# Patient Record
Sex: Male | Born: 1990 | Race: White | Hispanic: No | Marital: Married | Smoking: Never smoker
Health system: Southern US, Community
[De-identification: ages and names within clinical notes are randomized; demographics above are authoritative.]

## PROBLEM LIST (undated history)

## (undated) DIAGNOSIS — F900 Attention-deficit hyperactivity disorder, predominantly inattentive type: Secondary | ICD-10-CM

## (undated) DIAGNOSIS — R000 Tachycardia, unspecified: Principal | ICD-10-CM

## (undated) DIAGNOSIS — K219 Gastro-esophageal reflux disease without esophagitis: Secondary | ICD-10-CM

## (undated) DIAGNOSIS — M459 Ankylosing spondylitis of unspecified sites in spine: Secondary | ICD-10-CM

## (undated) DIAGNOSIS — N200 Calculus of kidney: Secondary | ICD-10-CM

## (undated) DIAGNOSIS — I10 Essential (primary) hypertension: Secondary | ICD-10-CM

## (undated) HISTORY — DX: Ankylosing spondylitis of unspecified sites in spine: M45.9

## (undated) HISTORY — DX: Essential (primary) hypertension: I10

## (undated) HISTORY — DX: Gastro-esophageal reflux disease without esophagitis: K21.9

## (undated) HISTORY — DX: Attention-deficit hyperactivity disorder, predominantly inattentive type: F90.0

## (undated) HISTORY — DX: Tachycardia, unspecified: R00.0

## (undated) HISTORY — DX: Calculus of kidney: N20.0

---

## 2015-11-02 DIAGNOSIS — M2550 Pain in unspecified joint: Secondary | ICD-10-CM | POA: Insufficient documentation

## 2015-11-02 DIAGNOSIS — R413 Other amnesia: Secondary | ICD-10-CM | POA: Insufficient documentation

## 2015-11-02 DIAGNOSIS — R253 Fasciculation: Secondary | ICD-10-CM | POA: Insufficient documentation

## 2015-11-02 DIAGNOSIS — R5383 Other fatigue: Secondary | ICD-10-CM | POA: Insufficient documentation

## 2016-07-15 DIAGNOSIS — Z87442 Personal history of urinary calculi: Secondary | ICD-10-CM | POA: Insufficient documentation

## 2017-09-16 NOTE — Progress Notes (Signed)
Cardiology Office Note   Date:  09/17/2017   ID:  Travis Dyer, DOB 01-23-1991, MRN [id number]  PCP:  [facility]  Cardiologist:   Chilton Si, MD   No chief complaint on file.    History of Present Illness: Travis Dyer is a 27 y.o. male with  Ankylosing spondylitis who is being seen today for the evaluation of chest pain and palpitations at the request of [name], PA-C.  Travis Dyer has a history of chest pain and shortness of breath dating back to 2016.  He saw Dr. Josiah Lobo in 2017.  He continues to have exertional chest pain.  He underwent an ETT that was negative for ischemia and he achieved 13 METS on a Bruce protocol.  He also had an echo that was reportedly normal at that time.   Since that time he continues to have exertional chest pain and shortness of breath that limits his ability to go up steps and exercise.  In the past he was very physically active.  Since that time he has been diagnosed with ankylosing spondylitis.  This has also limited his activity.  Last month he was diagnosed with hypertension.  Both parents have hypertension but developed it at a later age in life.  For the last several months Travis Dyer has also noted episodes of heart racing.  It occurs approximately once every 2 weeks.  He has more severe episodes approximately once per month for the last for 5 months.  The episodes last 5 or 10 minutes.  He gets lightheaded and dizzy but has no syncope.  He gets short of breath but has no chest pain.  His heart rate goes as fast as 180 or 200 bpm.  There is no clear precipitant.  He rarely drinks sweet tea and has no other caffeine.  He does not use any over-the-counter cold or cough medications.  He notes that his job is stressful but he does not feel particularly stressed or anxious when this occurs.  It is not tender occur with exertion.   Past Medical History:  Diagnosis Date  . Ankylosing spondylitis (HCC) 09/17/2017  . Essential hypertension 09/17/2017    . GERD (gastroesophageal reflux disease) 09/17/2017  . Kidney stones 09/17/2017    History reviewed. No pertinent surgical history.   Current Outpatient Medications  Medication Sig Dispense Refill  . DEXILANT 60 MG capsule Take 60 mg by mouth daily.     Marland Kitchen HUMIRA PEN 40 MG/0.4ML PNKT     . lisinopril (PRINIVIL,ZESTRIL) 5 MG tablet     . metoprolol tartrate (LOPRESSOR) 50 MG tablet 1 TABLET 1 HOUR PRIOR TO PROCEDURE 1 tablet 0   No current facility-administered medications for this visit.     Allergies:   Patient has no allergy information on record.    Social History:  The patient  reports that he has never smoked. He has never used smokeless tobacco.   Family History:  The patient's family history includes CAD in his maternal grandfather; Diabetes in his maternal grandmother; Hypertension in his father and mother; Pancreatic cancer in his maternal grandmother; Stroke in his paternal grandfather; Thyroid disease in his sister.    ROS:  Please see the history of present illness.   Otherwise, review of systems are positive for none.   All other systems are reviewed and negative.    PHYSICAL EXAM: VS:  BP 122/82 (BP Location: Right Arm)   Pulse 77   Ht 5\' 8"  (1.727 m)  Wt 187 lb 3.2 oz (84.9 kg)   BMI 28.46 kg/m  , BMI Body mass index is 28.46 kg/m. GENERAL:  Well appearing HEENT:  Pupils equal round and reactive, fundi not visualized, oral mucosa unremarkable NECK:  No jugular venous distention, waveform within normal limits, carotid upstroke brisk and symmetric, no bruits, no thyromegaly LYMPHATICS:  No cervical adenopathy LUNGS:  Clear to auscultation bilaterally HEART:  RRR.  PMI not displaced or sustained,S1 and S2 within normal limits, no S3, no S4, no clicks, no rubs, no murmurs ABD:  Flat, positive bowel sounds normal in frequency in pitch, no bruits, no rebound, no guarding, no midline pulsatile mass, no hepatomegaly, no splenomegaly EXT:  2 plus pulses throughout, no  edema, no cyanosis no clubbing SKIN:  No rashes no nodules NEURO:  Cranial nerves II through XII grossly intact, motor grossly intact throughout PSYCH:  Cognitively intact, oriented to person place and time    EKG:  EKG is ordered today. The ekg ordered 09/17/17 demonstrates sinus rhythm.  Rate 77 bpm.  Short PR interval.   ETT 03/31/16:  Negative, adequate stress test.  Exercised for 10.5 minutes on a Bruce protocol.  13 METS.  Echo (10/17): EF>55%, normal valvular fx, normal RVSF   Recent Labs: No results found for requested labs within last 8760 hours.    Lipid Panel No results found for: CHOL, TRIG, HDL, CHOLHDL, VLDL, LDLCALC, LDLDIRECT    Wt Readings from Last 3 Encounters:  09/17/17 187 lb 3.2 oz (84.9 kg)      ASSESSMENT AND PLAN:  # Chest pain: Mr. Travis BergeronKey continues to have exertional chest pain that is relieved with rest.  He had a normal ETT and echo in 2017.  We will get a coronary CT-A to evaluate for ischemia or coronary anomalies.  Check lipids and CMP.    # Palpitations: Recurrent symptoms lasting several minutes at a time.  He has a short PR interval on EKG.  There is no evidence of delta waves.  We will get a 30-day event monitor to further evaluate for Wolff-Parkinson-White syndrome or other arrhythmias.  Check thyroid function, CMP and CBC.   # Hypertension: BP controlled on lisinopril.   Current medicines are reviewed at length with the patient today.  The patient does not have concerns regarding medicines.  The following changes have been made:  no change  Labs/ tests ordered today include:   Orders Placed This Encounter  Procedures  . CT CORONARY MORPH W/CTA COR W/SCORE W/CA W/CM &/OR WO/CM  . CT CORONARY FRACTIONAL FLOW RESERVE DATA PREP  . CT CORONARY FRACTIONAL FLOW RESERVE FLUID ANALYSIS  . CBC with Differential/Platelet  . T4, free  . TSH  . Lipid panel  . Comprehensive metabolic panel  . EKG 12-Lead     Disposition:   FU with Jasnoor Trussell C.  Duke Salviaandolph, MD, [facility] in 2 months.    This note was written with the assistance of speech recognition software.  Please excuse any transcriptional errors.  Signed, Sumiye Hirth C. Duke Salviaandolph, MD, [facility]  09/17/2017 10:54 AM    [facility]

## 2017-09-17 ENCOUNTER — Ambulatory Visit: Payer: 59 | Admitting: Cardiovascular Disease

## 2017-09-17 ENCOUNTER — Encounter: Payer: Self-pay | Admitting: Cardiovascular Disease

## 2017-09-17 VITALS — BP 122/82 | HR 77 | Ht 68.0 in | Wt 187.2 lb

## 2017-09-17 DIAGNOSIS — N200 Calculus of kidney: Secondary | ICD-10-CM | POA: Diagnosis not present

## 2017-09-17 DIAGNOSIS — K219 Gastro-esophageal reflux disease without esophagitis: Secondary | ICD-10-CM | POA: Diagnosis not present

## 2017-09-17 DIAGNOSIS — M459 Ankylosing spondylitis of unspecified sites in spine: Secondary | ICD-10-CM

## 2017-09-17 DIAGNOSIS — R079 Chest pain, unspecified: Secondary | ICD-10-CM | POA: Diagnosis not present

## 2017-09-17 DIAGNOSIS — I10 Essential (primary) hypertension: Secondary | ICD-10-CM | POA: Insufficient documentation

## 2017-09-17 DIAGNOSIS — R002 Palpitations: Secondary | ICD-10-CM | POA: Diagnosis not present

## 2017-09-17 HISTORY — DX: Ankylosing spondylitis of unspecified sites in spine: M45.9

## 2017-09-17 HISTORY — DX: Gastro-esophageal reflux disease without esophagitis: K21.9

## 2017-09-17 HISTORY — DX: Calculus of kidney: N20.0

## 2017-09-17 HISTORY — DX: Essential (primary) hypertension: I10

## 2017-09-17 MED ORDER — METOPROLOL TARTRATE 50 MG PO TABS: ORAL_TABLET | ORAL | 0 refills | Status: DC

## 2017-09-17 NOTE — Patient Instructions (Addendum)
Medication Instructions:  Your physician recommends that you continue on your current medications as directed. Please refer to the Current Medication list given to you today.  Labwork: FASTING LP/CMET/CBC/TSH/FT4 SOON   Testing/Procedures: Your physician has recommended that you wear an event monitor. Event monitors are medical devices that record the heart's electrical activity. Doctors most often us these monitors to diagnose arrhythmias. Arrhythmias are problems with the speed or rhythm of the heartbeat. The monitor is a small, portable device. You can wear one while you do your normal daily activities. This is usually used to diagnose what is causing palpitations/syncope (passing out). [facility] AT [address]  CORONARY CTA, THE OFFICE WILL CALL WITH DATE AND TIME   Follow-Up: Your physician recommends that you schedule a follow-up appointment in: 2 MONTH OV   Any Other Special Instructions Will Be Listed Below (If Applicable).   [facility] at xx:xx AM (30-45 minutes prior to test start time)  [facility] [address] [place], [place] [address] [phone]  Proceed to the [facility] Radiology Department (First Floor).  Please follow these instructions carefully (unless otherwise directed):  Hold all erectile dysfunction medications at least 48 hours prior to test.  On the Night Before the Test: . Drink plenty of water. . Do not consume any caffeinated/decaffeinated beverages or chocolate 12 hours prior to your test. . Do not take any antihistamines 12 hours prior to your test. . If you take Metformin do not take 24 hours prior to test. . If the patient has contrast allergy: ? Patient will need a prescription for Prednisone and very clear instructions (as follows): 1. Prednisone 50 mg - take 13 hours prior to test 2. Take another Prednisone 50 mg 7 hours prior to test 3. Take  another Prednisone 50 mg 1 hour prior to test 4. Take Benadryl 50 mg 1 hour prior to test . Patient must complete all four doses of above prophylactic medications. . Patient will need a ride after test due to Benadryl.  On the Day of the Test: . Drink plenty of water. Do not drink any water within one hour of the test. . Do not eat any food 4 hours prior to the test. . You may take your regular medications prior to the test. . IF NOT ON A BETA BLOCKER - Take 50 mg of lopressor (metoprolol) one hour before the test. . HOLD Furosemide morning of the test.  After the Test: . Drink plenty of water. . After receiving IV contrast, you may experience a mild flushed feeling. This is normal. . On occasion, you may experience a mild rash up to 24 hours after the test. This is not dangerous. If this occurs, you can take Benadryl 25 mg and increase your fluid intake. . If you experience trouble breathing, this can be serious. If it is severe call 911 IMMEDIATELY. If it is mild, please call our office. . If you take any of these medications: Glipizide/Metformin, Avandament, Glucavance, please do not take 48 hours after completing test.    Cardiac CT Angiogram A cardiac CT angiogram is a procedure to look at the heart and the area around the heart. It may be done to help find the cause of chest pains or other symptoms of heart disease. During this procedure, a large X-ray machine, called a CT scanner, takes detailed pictures of the heart and the surrounding area after  a dye (contrast material) has been injected into blood vessels in the area. The procedure is also sometimes called a coronary CT angiogram, coronary artery scanning, or CTA. A cardiac CT angiogram allows the health care provider to see how well blood is flowing to and from the heart. The health care provider will be able to see if there are any problems, such as:  Blockage or narrowing of the coronary arteries in the heart.  Fluid around  the heart.  Signs of weakness or disease in the muscles, valves, and tissues of the heart.  Tell a health care provider about:  Any allergies you have. This is especially important if you have had a previous allergic reaction to contrast dye.  All medicines you are taking, including vitamins, herbs, eye drops, creams, and over-the-counter medicines.  Any blood disorders you have.  Any surgeries you have had.  Any medical conditions you have.  Whether you are pregnant or may be pregnant.  Any anxiety disorders, chronic pain, or other conditions you have that may increase your stress or prevent you from lying still. What are the risks? Generally, this is a safe procedure. However, problems may occur, including:  Bleeding.  Infection.  Allergic reactions to medicines or dyes.  Damage to other structures or organs.  Kidney damage from the dye or contrast that is used.  Increased risk of cancer from radiation exposure. This risk is low. Talk with your health care provider about: ? The risks and benefits of testing. ? How you can receive the lowest dose of radiation.  What happens before the procedure?  Wear comfortable clothing and remove any jewelry, glasses, dentures, and hearing aids.  Follow instructions from your health care provider about eating and drinking. This may include: ? For 12 hours before the test - avoid caffeine. This includes tea, coffee, soda, energy drinks, and diet pills. Drink plenty of water or other fluids that do not have caffeine in them. Being well-hydrated can prevent complications. ? For 4-6 hours before the test - stop eating and drinking. The contrast dye can cause nausea, but this is less likely if your stomach is empty.  Ask your health care provider about changing or stopping your regular medicines. This is especially important if you are taking diabetes medicines, blood thinners, or medicines to treat erectile dysfunction. What happens  during the procedure?  Hair on your chest may need to be removed so that small sticky patches called electrodes can be placed on your chest. These will transmit information that helps to monitor your heart during the test.  An IV tube will be inserted into one of your veins.  You might be given a medicine to control your heart rate during the test. This will help to ensure that good images are obtained.  You will be asked to lie on an exam table. This table will slide in and out of the CT machine during the procedure.  Contrast dye will be injected into the IV tube. You might feel warm, or you may get a metallic taste in your mouth.  You will be given a medicine (nitroglycerin) to relax (dilate) the arteries in your heart.  The table that you are lying on will move into the CT machine tunnel for the scan.  The person running the machine will give you instructions while the scans are being done. You may be asked to: ? Keep your arms above your head. ? Hold your breath. ? Stay very still, even if  the table is moving.  When the scanning is complete, you will be moved out of the machine.  The IV tube will be removed. The procedure may vary among health care providers and hospitals. What happens after the procedure?  You might feel warm, or you may get a metallic taste in your mouth from the contrast dye.  You may have a headache from the nitroglycerin.  After the procedure, drink water or other fluids to wash (flush) the contrast material out of your body.  Contact a health care provider if you have any symptoms of allergy to the contrast. These symptoms include: ? Shortness of breath. ? Rash or hives. ? A racing heartbeat.  Most people can return to their normal activities right after the procedure. Ask your health care provider what activities are safe for you.  It is up to you to get the results of your procedure. Ask your health care provider, or the department that is doing  the procedure, when your results will be ready. Summary  A cardiac CT angiogram is a procedure to look at the heart and the area around the heart. It may be done to help find the cause of chest pains or other symptoms of heart disease.  During this procedure, a large X-ray machine, called a CT scanner, takes detailed pictures of the heart and the surrounding area after a dye (contrast material) has been injected into blood vessels in the area.  Ask your health care provider about changing or stopping your regular medicines before the procedure. This is especially important if you are taking diabetes medicines, blood thinners, or medicines to treat erectile dysfunction.  After the procedure, drink water or other fluids to wash (flush) the contrast material out of your body. This information is not intended to replace advice given to you by your health care provider. Make sure you discuss any questions you have with your health care provider. Document Released: 05/18/2008 Document Revised: 04/24/2016 Document Reviewed: 04/24/2016 Elsevier Interactive Patient Education  2017 [employer].

## 2017-09-27 ENCOUNTER — Ambulatory Visit (INDEPENDENT_AMBULATORY_CARE_PROVIDER_SITE_OTHER): Payer: 59

## 2017-09-27 ENCOUNTER — Other Ambulatory Visit: Payer: Self-pay | Admitting: Internal Medicine

## 2017-09-27 ENCOUNTER — Other Ambulatory Visit: Payer: Self-pay | Admitting: Cardiovascular Disease

## 2017-09-27 DIAGNOSIS — R42 Dizziness and giddiness: Secondary | ICD-10-CM

## 2017-09-27 DIAGNOSIS — R002 Palpitations: Secondary | ICD-10-CM

## 2017-09-27 DIAGNOSIS — R9431 Abnormal electrocardiogram [ECG] [EKG]: Secondary | ICD-10-CM

## 2017-09-27 DIAGNOSIS — R000 Tachycardia, unspecified: Secondary | ICD-10-CM

## 2017-10-05 ENCOUNTER — Encounter: Payer: Self-pay | Admitting: Cardiovascular Disease

## 2017-11-09 ENCOUNTER — Ambulatory Visit (HOSPITAL_COMMUNITY): Payer: 59

## 2017-11-09 ENCOUNTER — Ambulatory Visit (HOSPITAL_COMMUNITY)
Admission: RE | Admit: 2017-11-09 | Discharge: 2017-11-09 | Disposition: A | Discharge status: Home / Self Care | Payer: 59 | Source: Ambulatory Visit | Attending: Cardiovascular Disease | Admitting: Cardiovascular Disease

## 2017-11-09 ENCOUNTER — Encounter (HOSPITAL_COMMUNITY): Payer: Self-pay

## 2017-11-09 DIAGNOSIS — R002 Palpitations: Secondary | ICD-10-CM | POA: Diagnosis present

## 2017-11-09 DIAGNOSIS — I10 Essential (primary) hypertension: Secondary | ICD-10-CM | POA: Diagnosis present

## 2017-11-09 DIAGNOSIS — R079 Chest pain, unspecified: Secondary | ICD-10-CM | POA: Diagnosis not present

## 2017-11-09 MED ORDER — NITROGLYCERIN 0.4 MG SL SUBL
SUBLINGUAL_TABLET | SUBLINGUAL | Status: AC
  Administered 2017-11-09: 0.8 mg via SUBLINGUAL
  Filled 2017-11-09: qty 2

## 2017-11-09 MED ORDER — NITROGLYCERIN 0.4 MG SL SUBL
0.8000 mg | SUBLINGUAL_TABLET | Freq: Once | SUBLINGUAL | Status: AC
  Administered 2017-11-09: 0.8 mg via SUBLINGUAL

## 2017-11-09 MED ORDER — METOPROLOL TARTRATE 5 MG/5ML IV SOLN
INTRAVENOUS | Status: AC
  Administered 2017-11-09: 5 mg via INTRAVENOUS
  Filled 2017-11-09: qty 15

## 2017-11-09 MED ORDER — METOPROLOL TARTRATE 5 MG/5ML IV SOLN
5.0000 mg | INTRAVENOUS | Status: DC | PRN
  Administered 2017-11-09: 5 mg via INTRAVENOUS

## 2017-11-09 MED ORDER — IOPAMIDOL (ISOVUE-370) INJECTION 76%
INTRAVENOUS | Status: AC
  Filled 2017-11-09: qty 100

## 2017-11-09 MED ORDER — IOPAMIDOL (ISOVUE-370) INJECTION 76%
100.0000 mL | Freq: Once | INTRAVENOUS | Status: AC | PRN
  Administered 2017-11-09: 80 mL via INTRAVENOUS

## 2017-12-07 ENCOUNTER — Encounter: Payer: Self-pay | Admitting: Cardiovascular Disease

## 2017-12-07 ENCOUNTER — Ambulatory Visit: Payer: 59 | Admitting: Cardiovascular Disease

## 2017-12-07 VITALS — BP 114/76 | HR 61 | Ht 68.0 in | Wt 188.0 lb

## 2017-12-07 DIAGNOSIS — R000 Tachycardia, unspecified: Secondary | ICD-10-CM | POA: Diagnosis not present

## 2017-12-07 DIAGNOSIS — I10 Essential (primary) hypertension: Secondary | ICD-10-CM

## 2017-12-07 DIAGNOSIS — I4711 Inappropriate sinus tachycardia, so stated: Secondary | ICD-10-CM

## 2017-12-07 HISTORY — DX: Inappropriate sinus tachycardia, so stated: I47.11

## 2017-12-07 HISTORY — DX: Tachycardia, unspecified: R00.0

## 2017-12-07 MED ORDER — METOPROLOL TARTRATE 25 MG PO TABS: ORAL_TABLET | ORAL | 3 refills | Status: DC

## 2017-12-07 NOTE — Patient Instructions (Addendum)
Medication Instructions:  START METOPROLOL TART 25 MG 1/2 TABLET TWICE A  DAY AS NEEDED OK TO INCREASE TO FULL TABLET IF NEEDED   Labwork: NONE  Testing/Procedures: NONE  Follow-Up: Your physician recommends that you schedule a follow-up appointment in: 6 MONTH OV   If you need a refill on your cardiac medications before your next appointment, please call your pharmacy.

## 2017-12-07 NOTE — Progress Notes (Signed)
Cardiology Office Note   Date:  12/07/2017   ID:  Travis Dyer, DOB May 17, 1991, MRN [id number]  PCP:  [facility]  Cardiologist:   Chilton Si, MD   Chief Complaint  Patient presents with  . Shortness of Breath    states with activity      History of Present Illness: Travis Dyer is a 27 y.o. male with ankylosing spondylitis here for follow up.  He was initially seen 09/2017 for chest pain and palpitations.  Travis Dyer has a history of chest pain and shortness of breath dating back to 2016.  He underwent an ETT that was negative for ischemia and he achieved 13 METS on a Bruce protocol.  He also had an echo that was reportedly normal at that time.   Since that time he continued to have exertional chest pain and shortness of breath that limits his ability to go up steps and exercise.  In the past he was very physically active.  Since that time he has been diagnosed with ankylosing spondylitis.  At his last appointment he was referred for coronary CT-a 10/2017 that revealed a coronary calcium score of 0 and no coronary artery stenosis.  No coronary anomalies were noted.  He also reported episodes of heart racing.  He wore a 30-day event monitor that showed no arrhythmias but episodes of sinus tachycardia up to the 160s at rest.  Since his last appointment Travis Dyer continues to have episodes of tachycardia.  They seem to occur sporadically.  He goes through phases where it happens frequently for 2 or 3 weeks and then not at all.  When he is having the episodes it occurs once or twice per week.  The episodes last between 5 and 10 minutes.  Afterwards he felt horrible.  These are the times when he typically experiences chest pain.  He notes that Humira is helping with his ankylosing spondylitis.  He walks 5 to 10 miles at work but does not get much formal exercise outside of work.  He has no chest pain or shortness of breath with exertion.  He denies any lower extremity edema, orthopnea or  PND.   Past Medical History:  Diagnosis Date  . Ankylosing spondylitis (HCC) 09/17/2017  . Essential hypertension 09/17/2017  . GERD (gastroesophageal reflux disease) 09/17/2017  . Inappropriate sinus tachycardia 12/07/2017  . Kidney stones 09/17/2017    History reviewed. No pertinent surgical history.   Current Outpatient Medications  Medication Sig Dispense Refill  . DEXILANT 60 MG capsule Take 60 mg by mouth daily.     Marland Kitchen HUMIRA PEN 40 MG/0.4ML PNKT     . lisinopril (PRINIVIL,ZESTRIL) 5 MG tablet     . metoprolol tartrate (LOPRESSOR) 25 MG tablet TAKE 1/2 TO 1 TABLET TWICE A DAY AS NEEDED 60 tablet 3   No current facility-administered medications for this visit.     Allergies:   Patient has no known allergies.    Social History:  The patient  reports that he has never smoked. He has never used smokeless tobacco.   Family History:  The patient's family history includes CAD in his maternal grandfather; Diabetes in his maternal grandmother; Hypertension in his father and mother; Pancreatic cancer in his maternal grandmother; Stroke in his paternal grandfather; Thyroid disease in his sister.    ROS:  Please see the history of present illness.   Otherwise, review of systems are positive for none.   All other systems are reviewed and  negative.    PHYSICAL EXAM: VS:  BP 114/76   Pulse 61   Ht 5\' 8"  (1.727 m)   Wt 188 lb (85.3 kg)   SpO2 100%   BMI 28.59 kg/m  , BMI Body mass index is 28.59 kg/m. GENERAL:  Well appearing HEENT: Pupils equal round and reactive, fundi not visualized, oral mucosa unremarkable NECK:  No jugular venous distention, waveform within normal limits, carotid upstroke brisk and symmetric, no bruits LUNGS:  Clear to auscultation bilaterally HEART:  RRR.  PMI not displaced or sustained,S1 and S2 within normal limits, no S3, no S4, no clicks, no rubs, no murmurs ABD:  Flat, positive bowel sounds normal in frequency in pitch, no bruits, no rebound, no guarding, no  midline pulsatile mass, no hepatomegaly, no splenomegaly EXT:  2 plus pulses throughout, no edema, no cyanosis no clubbing SKIN:  No rashes no nodules NEURO:  Cranial nerves II through XII grossly intact, motor grossly intact throughout PSYCH:  Cognitively intact, oriented to person place and time   EKG:  EKG is not ordered today. The ekg ordered 09/17/17 demonstrates sinus rhythm.  Rate 77 bpm.  Short PR interval.  30 Day Event Monitor 09/2017:  Quality: Fair.  Baseline artifact. Predominant rhythm: sinus rhythm No significant arrhythmias noted.   Palpitations were noted, at which time sinus tachycardia up to 160 bpm was recorded.  Coronary CT-A 11/09/17: IMPRESSION: 1. Coronary artery calcium score 0 Agatston units, suggesting low risk for future cardiac events.  2.  No plaque or stenosis noted in the coronary arteries.   ETT 03/31/16:  Negative, adequate stress test.  Exercised for 10.5 minutes on a Bruce protocol.  13 METS.   Echo (10/17): EF>55%, normal valvular fx, normal RVSF   Recent Labs: No results found for requested labs within last 8760 hours.   11/19/2017: Total cholesterol 132, triglycerides 83, HDL 50, LDL 65 Hemoglobin 16.114.4, platelets 213 AST 12 TSH 0.7  Lipid Panel No results found for: CHOL, TRIG, HDL, CHOLHDL, VLDL, LDLCALC, LDLDIRECT    Wt Readings from Last 3 Encounters:  12/07/17 188 lb (85.3 kg)  09/17/17 187 lb 3.2 oz (84.9 kg)      ASSESSMENT AND PLAN:  # Chest pain: Resolved.  Coronary CT-A without CAD.   # Inappropriate sinus tachycardia:  The event monitor revealed inappropriate sinus tachycardia.  Laboratory function was reportedly normal.  These were checked with his PCP.  We will get a copy.  There are no other clear triggers for his symptoms.  Given that he does not have them every day we will try metoprolol tartrate 12.5 mg twice daily as needed.  He can increase this to 25 mg if needed.    # Hypertension: BP controlled.   Continue lisinopril.   Current medicines are reviewed at length with the patient today.  The patient does not have concerns regarding medicines.  The following changes have been made:  no change  Labs/ tests ordered today include:   No orders of the defined types were placed in this encounter.    Disposition:   FU with Hurschel Paynter C. Duke Salviaandolph, MD, [facility] in 6 months.      Signed, Leisha Trinkle C. Duke Salviaandolph, MD, [facility]  12/07/2017 8:32 AM    [facility]

## 2018-05-28 ENCOUNTER — Encounter: Payer: Self-pay | Admitting: Cardiovascular Disease

## 2018-05-28 ENCOUNTER — Ambulatory Visit (INDEPENDENT_AMBULATORY_CARE_PROVIDER_SITE_OTHER): Payer: BLUE CROSS/BLUE SHIELD | Admitting: Cardiovascular Disease

## 2018-05-28 VITALS — BP 116/80 | HR 64 | Ht 68.0 in | Wt 196.0 lb

## 2018-05-28 DIAGNOSIS — R000 Tachycardia, unspecified: Secondary | ICD-10-CM

## 2018-05-28 DIAGNOSIS — I10 Essential (primary) hypertension: Secondary | ICD-10-CM

## 2018-05-28 NOTE — Progress Notes (Signed)
Cardiology Office Note   Date:  05/28/2018   ID:  Travis Dyer Maull, DOB 1991-04-21, MRN [id number]  PCP:  [facility]  Cardiologist:   Chilton Siiffany , MD   No chief complaint on file.    History of Present Illness: Travis Dyer [place] is a 27 y.o. male with ankylosing spondylitis and inappropriate sinus tachycardia here for follow up.  He was initially seen 09/2017 for chest pain and palpitations.  [name] has a history of chest pain and shortness of breath dating back to 2016.  He underwent an ETT that was negative for ischemia and he achieved 13 METS on a Bruce protocol.  He also had an echo that was reportedly normal at that time.   He continued to have chest pain and was referred for coronary CT-A 10/2017 that revealed a coronary calcium score of 0 and no coronary artery stenosis.  No coronary anomalies were noted.  He also reported episodes of heart racing.  He wore a 30-day event monitor 09/2017 that showed no arrhythmias but did show episodes of sinus tachycardia up to the 160s at rest.  He was started on metoprolol prn.  He continues to have some episodes of palpitations that occur sporadically.  He wears a watch that monitors his heart rate and shows that his heart rate is been as fast as the 200s.  Most of the time when this occurs he is unaware.  It is happened 4 or 5 times since his last appointment.  He has had some episodes of palpitations that were symptomatic.  A couple times he was able to take metoprolol and this did seem to help.  He has not experienced any chest pain and his breathing has been stable.  He denies lower extremity edema, orthopnea, or PND.  He recently started a new job and has a long commute so he has not been exercising.   Past Medical History:  Diagnosis Date  . Ankylosing spondylitis (HCC) 09/17/2017  . Essential hypertension 09/17/2017  . GERD (gastroesophageal reflux disease) 09/17/2017  . Inappropriate sinus tachycardia 12/07/2017  . Kidney stones 09/17/2017      History reviewed. No pertinent surgical history.   Current Outpatient Medications  Medication Sig Dispense Refill  . DEXILANT 60 MG capsule Take 60 mg by mouth daily.     Marland Kitchen. HUMIRA PEN 40 MG/0.4ML PNKT     . lisinopril (PRINIVIL,ZESTRIL) 5 MG tablet     . metoprolol tartrate (LOPRESSOR) 25 MG tablet TAKE 1/2 TO 1 TABLET TWICE A DAY AS NEEDED 60 tablet 3   No current facility-administered medications for this visit.     Allergies:   Patient has no known allergies.    Social History:  The patient  reports that he has never smoked. He has never used smokeless tobacco.   Family History:  The patient's family history includes CAD in his maternal grandfather; Diabetes in his maternal grandmother; Hypertension in his father and mother; Pancreatic cancer in his maternal grandmother; Stroke in his paternal grandfather; Thyroid disease in his sister.    ROS:  Please see the history of present illness.   Otherwise, review of systems are positive for none.   All other systems are reviewed and negative.    PHYSICAL EXAM: VS:  BP 116/80   Pulse 64   Ht 5\' 8"  (1.727 m)   Wt 196 lb (88.9 kg)   BMI 29.80 kg/m  , BMI Body mass index is 29.8 kg/m. GENERAL:  Well  appearing HEENT: Pupils equal round and reactive, fundi not visualized, oral mucosa unremarkable NECK:  No jugular venous distention, waveform within normal limits, carotid upstroke brisk and symmetric, no bruits, no thyromegaly LUNGS:  Clear to auscultation bilaterally HEART:  RRR.  PMI not displaced or sustained,S1 and S2 within normal limits, no S3, no S4, no clicks, no rubs, no murmurs ABD:  Flat, positive bowel sounds normal in frequency in pitch, no bruits, no rebound, no guarding, no midline pulsatile mass, no hepatomegaly, no splenomegaly EXT:  2 plus pulses throughout, no edema, no cyanosis no clubbing SKIN:  No rashes no nodules NEURO:  Cranial nerves II through XII grossly intact, motor grossly intact throughout PSYCH:   Cognitively intact, oriented to person place and time    EKG:  EKG is ordered today. The ekg ordered 09/17/17 demonstrates sinus rhythm.  Rate 77 bpm.  Short PR interval. 05/28/18: Sinus rhythm.  Rate 64 bpm.  Short PR.   30 Day Event Monitor 09/2017:  Quality: Fair.  Baseline artifact. Predominant rhythm: sinus rhythm No significant arrhythmias noted.   Palpitations were noted, at which time sinus tachycardia up to 160 bpm was recorded.  Coronary CT-A 11/09/17: IMPRESSION: 1. Coronary artery calcium score 0 Agatston units, suggesting low risk for future cardiac events.  2.  No plaque or stenosis noted in the coronary arteries.   ETT 03/31/16:  Negative, adequate stress test.  Exercised for 10.5 minutes on a Bruce protocol.  13 METS.   Echo (10/17): EF>55%, normal valvular fx, normal RVSF   Recent Labs: No results found for requested labs within last 8760 hours.   11/19/2017: Total cholesterol 132, triglycerides 83, HDL 50, LDL 65 Hemoglobin 40.9, platelets 213 AST 12 TSH 0.7  Lipid Panel No results found for: CHOL, TRIG, HDL, CHOLHDL, VLDL, LDLCALC, LDLDIRECT    Wt Readings from Last 3 Encounters:  05/28/18 196 lb (88.9 kg)  12/07/17 188 lb (85.3 kg)  09/17/17 187 lb 3.2 oz (84.9 kg)      ASSESSMENT AND PLAN:  # Chest pain: Resolved.  Coronary CT-A without CAD.   # Inappropriate sinus tachycardia:  The event monitor revealed inappropriate sinus tachycardia.  Labs have been unremarkable.  Metoprolol does seem to help when he takes it, though the episodes sometimes do not last long enough for him to need it.  He also has episodes that occur without him knowing.  They do not seem to be stress or anxiety related.  We will check urine metanephrines and catecholamines to evaluate for pheochromocytoma.  Otherwise continue metoprolol as needed.     # Hypertension: BP controlled.  Continue lisinopril.   Current medicines are reviewed at length with the patient today.   The patient does not have concerns regarding medicines.  The following changes have been made:  no change  Labs/ tests ordered today include:   Orders Placed This Encounter  Procedures  . Catecholamines, fractionated, Urine, 24 hour  . Metanephrines, urine, 24 hour  . EKG 12-Lead     Disposition:   FU with Cherylene Ferrufino C. Duke Salvia, MD, [facility] in 1 year.    Signed, Kyli Sorter C. Duke Salvia, MD, [facility]  05/28/2018 4:19 PM    [facility]

## 2018-05-28 NOTE — Patient Instructions (Signed)
Medication Instructions:  NO CHANGES  Lab work: 24 HOUR URINE  If you have labs (blood work) drawn today and your tests are completely normal, you will receive your results only by: Marland Kitchen. MyChart Message (if you have MyChart) OR . A paper copy in the mail If you have any lab test that is abnormal or we need to change your treatment, we will call you to review the results.  Testing/Procedures: NONE  Follow-Up: At [facility], you and your health needs are our priority.  As part of our continuing mission to provide you with exceptional heart care, we have created designated Provider Care Teams.  These Care Teams include your primary Cardiologist (physician) and Advanced Practice Providers (APPs -  Physician Assistants and Nurse Practitioners) who all work together to provide you with the care you need, when you need it. You will need a follow up appointment in 12 months.  Please call our office 2 months in advance to schedule this appointment.  You may see DR [facility]  or one of the following Advanced Practice Providers on your designated Care Team:   Corine ShelterLuke Kilroy, PA-C Judy PimpleKrista Kroeger, [place] . Marjie Skiffallie Goodrich, PA-C

## 2019-07-23 ENCOUNTER — Other Ambulatory Visit: Payer: Self-pay

## 2019-07-23 ENCOUNTER — Ambulatory Visit (INDEPENDENT_AMBULATORY_CARE_PROVIDER_SITE_OTHER): Payer: BLUE CROSS/BLUE SHIELD | Admitting: Cardiovascular Disease

## 2019-07-23 ENCOUNTER — Encounter: Payer: Self-pay | Admitting: Cardiovascular Disease

## 2019-07-23 VITALS — BP 123/80 | HR 112 | Temp 97.7°F | Ht 68.0 in | Wt 193.8 lb

## 2019-07-23 DIAGNOSIS — R000 Tachycardia, unspecified: Secondary | ICD-10-CM

## 2019-07-23 DIAGNOSIS — I10 Essential (primary) hypertension: Secondary | ICD-10-CM

## 2019-07-23 MED ORDER — NEBIVOLOL HCL 10 MG PO TABS: 10.0000 mg | ORAL_TABLET | Freq: Every day | ORAL | 1 refills | Status: DC

## 2019-07-23 NOTE — Patient Instructions (Signed)
Medication Instructions:  STOP LISINOPRIL   START BYSTOLIC 10 MG DAILY   *If you need a refill on your cardiac medications before your next appointment, please call your pharmacy*  Lab Work: NONE   Testing/Procedures: NONE  Follow-Up: At [facility], you and your health needs are our priority.  As part of our continuing mission to provide you with exceptional heart care, we have created designated Provider Care Teams.  These Care Teams include your primary Cardiologist (physician) and Advanced Practice Providers (APPs -  Physician Assistants and Nurse Practitioners) who all work together to provide you with the care you need, when you need it.  Your next appointment:   4 week(s)  The format for your next appointment:   Virtual Visit   Provider:   You may see DR [facility] or one of the following Advanced Practice Providers on your designated Care Team:    Corine Shelter, PA-C  [place], [place]  Edd Fabian, [place]   Other Instructions  MONITOR AND LOG YOU BLOOD PRESSURE AT HOME TWICE A DAY HAVE FOR YOUR FOLLOW UP VISIT IN 1 MONTH

## 2019-07-23 NOTE — Progress Notes (Signed)
Cardiology Office Note   Date:  07/23/2019   ID:  [name], DOB 1991/05/17, MRN [id number]  PCP:  [facility]  Cardiologist:   Skeet Latch, MD   No chief complaint on file.    History of Present Illness: Travis Dyer is a 29 y.o. male with ankylosing spondylitis and inappropriate sinus tachycardia here for follow up.  He was initially seen 09/2017 for chest pain and palpitations.  Travis Dyer has a history of chest pain and shortness of breath dating back to 2016.  He underwent an ETT that was negative for ischemia and he achieved 13 METS on a Bruce protocol.  He also had an echo that was reportedly normal at that time.   He continued to have chest pain and was referred for coronary CT-A 10/2017 that revealed a coronary calcium score of 0 and no coronary artery stenosis.  No coronary anomalies were noted.  He also reported episodes of heart racing.  He wore a 30-day event monitor 09/2017 that showed no arrhythmias but did show episodes of sinus tachycardia up to the 160s at rest.  He was started on metoprolol prn.  He continued to have some episodes of palpitations that occur sporadically.  He wears a watch that monitors his heart rate and shows that his heart rate is been as fast as the 200s.    Since his last appointment Travis Dyer has been doing okay.  He continues to have episodes of heart racing.  It occurs at rest and last for couple minutes at a time.  Afterwards he feels lightheaded.  He feels as though he is running but he is just sitting there.  He checks his blood pressure and is blood pressures been running in the 130s to 140s over 90s despite taking lisinopril.  The dose was increased to 10 mg and it does not seem to be helping.  He has not been getting much exercise lately.  He has a lot of fatigue from ankylosing spondylitis.  He also has some pain, though that seems to have been better controlled lately.  He denies any lower extremity edema, orthopnea, or PND.  He has pain  in his sternum but no chest pain and his breathing has been stable.   Past Medical History:  Diagnosis Date  . Ankylosing spondylitis ([place]) 09/17/2017  . Essential hypertension 09/17/2017  . GERD (gastroesophageal reflux disease) 09/17/2017  . Inappropriate sinus tachycardia 12/07/2017  . Kidney stones 09/17/2017    No past surgical history on file.   Current Outpatient Medications  Medication Sig Dispense Refill  . amphetamine-dextroamphetamine (ADDERALL) 15 MG tablet Take 1 tablet by mouth every morning.    Marland Kitchen HUMIRA PEN 40 MG/0.4ML PNKT     . pantoprazole (PROTONIX) 40 MG tablet Take 40 mg by mouth daily.    . nebivolol (BYSTOLIC) 10 MG tablet Take 1 tablet (10 mg total) by mouth daily. 90 tablet 1   No current facility-administered medications for this visit.    Allergies:   Patient has no known allergies.    Social History:  The patient  reports that he has never smoked. He has never used smokeless tobacco.   Family History:  The patient's family history includes CAD in his maternal grandfather; Diabetes in his maternal grandmother; Hypertension in his father and mother; Pancreatic cancer in his maternal grandmother; Stroke in his paternal grandfather; Thyroid disease in his sister.    ROS:  Please see the history of present  illness.   Otherwise, review of systems are positive for none.   All other systems are reviewed and negative.    PHYSICAL EXAM: VS:  BP 123/80   Pulse (!) 112   Temp 97.7 F (36.5 C)   Ht 5\' 8"  (1.727 m)   Wt 193 lb 12.8 oz (87.9 kg)   SpO2 99%   BMI 29.47 kg/m  , BMI Body mass index is 29.47 kg/m. GENERAL:  Well appearing HEENT: Pupils equal round and reactive, fundi not visualized, oral mucosa unremarkable NECK:  No jugular venous distention, waveform within normal limits, carotid upstroke brisk and symmetric, no bruits, no thyromegaly LUNGS:  Clear to auscultation bilaterally HEART:  RRR.  PMI not displaced or sustained,S1 and S2 within normal  limits, no S3, no S4, no clicks, no rubs, no murmurs ABD:  Flat, positive bowel sounds normal in frequency in pitch, no bruits, no rebound, no guarding, no midline pulsatile mass, no hepatomegaly, no splenomegaly EXT:  2 plus pulses throughout, no edema, no cyanosis no clubbing SKIN:  No rashes no nodules NEURO:  Cranial nerves II through XII grossly intact, motor grossly intact throughout PSYCH:  Cognitively intact, oriented to person place and time    EKG:  EKG is ordered today. The ekg ordered 09/17/17 demonstrates sinus rhythm.  Rate 77 bpm.  Short PR interval. 05/28/18: Sinus rhythm.  Rate 64 bpm.  Short PR.  07/23/19:   30 Day Event Monitor 09/2017:  Quality: Fair.  Baseline artifact. Predominant rhythm: sinus rhythm No significant arrhythmias noted.   Palpitations were noted, at which time sinus tachycardia up to 160 bpm was recorded.  Coronary CT-A 11/09/17: IMPRESSION: 1. Coronary artery calcium score 0 Agatston units, suggesting low risk for future cardiac events.  2.  No plaque or stenosis noted in the coronary arteries.   ETT 03/31/16:  Negative, adequate stress test.  Exercised for 10.5 minutes on a Bruce protocol.  13 METS.   Echo (10/17): EF>55%, normal valvular fx, normal RVSF   Recent Labs: No results found for requested labs within last 8760 hours.   11/19/2017: Total cholesterol 132, triglycerides 83, HDL 50, LDL 65 Hemoglobin 01/19/2018, platelets 213 AST 12 TSH 0.7  Lipid Panel No results found for: CHOL, TRIG, HDL, CHOLHDL, VLDL, LDLCALC, LDLDIRECT    Wt Readings from Last 3 Encounters:  07/23/19 193 lb 12.8 oz (87.9 kg)  05/28/18 196 lb (88.9 kg)  12/07/17 188 lb (85.3 kg)      ASSESSMENT AND PLAN:  # Chest pain: Resolved.  Coronary CT-A without CAD.   # Inappropriate sinus tachycardia:  The event monitor revealed inappropriate sinus tachycardia.  Labs have been unremarkable.  He continues to have episodes.  He has previously taken metoprolol,  but he has fatigue due to ankylosing spondylitis and is concerned about taking it daily.  Therefore we will switch to nebivolol 10 mg daily.  # Hypertension: BP has been running little high lately.  We will stop lisinopril and switch to nebivolol as above.  Current medicines are reviewed at length with the patient today.  The patient does not have concerns regarding medicines.  The following changes have been made: Stop lisinopril.  Start nebivolol 10 mg daily.  Labs/ tests ordered today include:   Orders Placed This Encounter  Procedures  . EKG 12-Lead     Disposition:   FU with Mayme Profeta C. 12/09/17, MD, [facility] in 1 month via televisit   Signed, Kena Limon C. [facility], MD, [facility]  07/23/2019 5:44 PM  [facility]

## 2019-07-25 ENCOUNTER — Telehealth: Payer: Self-pay | Admitting: *Deleted

## 2019-07-25 NOTE — Telephone Encounter (Signed)
Left message to call back to discuss Bystolic Tried to submit PA with [facility], could not find patient will need to verify insurance

## 2019-07-28 NOTE — Telephone Encounter (Signed)
Spoke with patient and he has [employer] ID # [id number] group # [id number]  PA submitted through covermymeds

## 2019-07-31 NOTE — Telephone Encounter (Signed)
Insurance denied Bystolic Discussed with Dr Duke Salvia and will have patient start Bisoprolol 5 mg daily  Left message to call back

## 2019-08-05 ENCOUNTER — Other Ambulatory Visit: Payer: Self-pay

## 2019-08-05 MED ORDER — BISOPROLOL FUMARATE 5 MG PO TABS: 5.0000 mg | ORAL_TABLET | Freq: Every day | ORAL | 1 refills | Status: DC

## 2019-08-05 NOTE — Telephone Encounter (Signed)
Advised patient, verbalized understanding  

## 2019-08-18 ENCOUNTER — Encounter: Payer: Self-pay | Admitting: Nurse Practitioner

## 2019-08-18 ENCOUNTER — Other Ambulatory Visit: Payer: Self-pay

## 2019-08-18 ENCOUNTER — Ambulatory Visit (INDEPENDENT_AMBULATORY_CARE_PROVIDER_SITE_OTHER): Payer: BC Managed Care – PPO | Admitting: Nurse Practitioner

## 2019-08-18 VITALS — BP 110/82 | HR 66 | Temp 97.5°F | Resp 18 | Ht 68.0 in | Wt 195.0 lb

## 2019-08-18 DIAGNOSIS — R102 Pelvic and perineal pain: Secondary | ICD-10-CM | POA: Insufficient documentation

## 2019-08-18 DIAGNOSIS — R350 Frequency of micturition: Secondary | ICD-10-CM | POA: Insufficient documentation

## 2019-08-18 DIAGNOSIS — N200 Calculus of kidney: Secondary | ICD-10-CM

## 2019-08-18 LAB — POCT URINALYSIS DIPSTICK
Bilirubin, UA: NEGATIVE
Glucose, UA: NEGATIVE
Ketones, UA: NEGATIVE
Leukocytes, UA: NEGATIVE
Nitrite, UA: NEGATIVE
Protein, UA: NEGATIVE
Spec Grav, UA: >=1.03 — AB (ref 1.010–1.025)
Urobilinogen, UA: 0.2 E.U./dL
pH, UA: 6 (ref 5.0–8.0)

## 2019-08-18 MED ORDER — HYDROCODONE-ACETAMINOPHEN 10-325 MG PO TABS: 1.0000 | ORAL_TABLET | Freq: Four times a day (QID) | ORAL | 0 refills | Status: AC | PRN

## 2019-08-18 MED ORDER — TAMSULOSIN HCL 0.4 MG PO CAPS: 0.4000 mg | ORAL_CAPSULE | Freq: Every day | ORAL | 0 refills | Status: DC

## 2019-08-18 MED ORDER — AMPHETAMINE-DEXTROAMPHETAMINE 15 MG PO TABS: 15.0000 mg | ORAL_TABLET | Freq: Every morning | ORAL | 0 refills | Status: DC

## 2019-08-18 NOTE — Assessment & Plan Note (Signed)
UA with cultures collected

## 2019-08-18 NOTE — Assessment & Plan Note (Signed)
Pain medication, Flomax, increase fluid, Renal ultra sound

## 2019-08-18 NOTE — Progress Notes (Signed)
Acute Office Visit  Subjective:    Patient ID: Travis Dyer, male    DOB: 1991-02-21, 29 y.o.   MRN: [id number]  Chief Complaint  Patient presents with  . Urinary Tract Infection   Patient  is a 29 year old male he is here for kidney stones, UTI and pelvic pain. The patient's medications were reviewed and reconciled since the patient's last visit.  History details were provided by the patient. The history appears to be reliable.    HPI Patient is in today for Urinary Tract Infection: Patient complains of pelvic pain, lower back pain, and flank pain.  has had symptoms for a few days, Patient denies vomiting and  fever,  Patient has a history of UTI and does have a history of kidney stone.    Past Medical History:  Diagnosis Date  . Ankylosing spondylitis ([place]) 09/17/2017  . Attention deficit hyperactivity disorder, predominantly inattentive type   . Essential hypertension 09/17/2017  . GERD (gastroesophageal reflux disease) 09/17/2017  . Inappropriate sinus tachycardia 12/07/2017  . Kidney stones 09/17/2017    History reviewed. No pertinent surgical history.  Family History  Problem Relation Age of Onset  . Hypertension Mother   . Hypertension Father   . Thyroid disease Sister   . Pancreatic cancer Maternal Grandmother   . Diabetes Maternal Grandmother   . CAD Maternal Grandfather   . Stroke Paternal Grandfather     Social History   Socioeconomic History  . Marital status: Single    Spouse name: Not on file  . Number of children: Not on file  . Years of education: Not on file  . Highest education level: Not on file  Occupational History  . Not on file  Tobacco Use  . Smoking status: Never Smoker  . Smokeless tobacco: Never Used  Substance and Sexual Activity  . Alcohol use: Not on file  . Drug use: Not on file  . Sexual activity: Not on file  Other Topics Concern  . Not on file  Social History Narrative  . Not on file   Social Determinants of Health    Financial Resource Strain:   . Difficulty of Paying Living Expenses:   Food Insecurity:   . Worried About Charity fundraiser in the Last Year:   . Arboriculturist in the Last Year:   Transportation Needs:   . Film/video editor (Medical):   Marland Kitchen Lack of Transportation (Non-Medical):   Physical Activity:   . Days of Exercise per Week:   . Minutes of Exercise per Session:   Stress:   . Feeling of Stress :   Social Connections:   . Frequency of Communication with Friends and Family:   . Frequency of Social Gatherings with Friends and Family:   . Attends Religious Services:   . Active Member of Clubs or Organizations:   . Attends Archivist Meetings:   Marland Kitchen Marital Status:   Intimate Partner Violence:   . Fear of Current or Ex-Partner:   . Emotionally Abused:   Marland Kitchen Physically Abused:   . Sexually Abused:     Outpatient Medications Prior to Visit  Medication Sig Dispense Refill  . bisoprolol (ZEBETA) 5 MG tablet Take 5 mg by mouth daily.    Marland Kitchen HUMIRA PEN 40 MG/0.4ML PNKT     . pantoprazole (PROTONIX) 40 MG tablet Take 40 mg by mouth daily.    Marland Kitchen amphetamine-dextroamphetamine (ADDERALL) 15 MG tablet Take 1 tablet by mouth every  morning.    . etodolac (LODINE) 400 MG tablet Take 400 mg by mouth 2 (two) times daily.     No facility-administered medications prior to visit.    No Known Allergies  Review of Systems  Constitutional: Negative for activity change, appetite change, fatigue and fever.  HENT: Negative for congestion, ear pain and sinus pain.   Eyes: Negative for pain, discharge and itching.  Respiratory: Negative for cough, chest tightness and shortness of breath.   Cardiovascular: Negative for chest pain and palpitations.  Gastrointestinal: Positive for nausea. Negative for abdominal distention, abdominal pain, constipation and vomiting.  Genitourinary: Positive for difficulty urinating, flank pain and hematuria. Negative for discharge, genital sores, penile  pain, penile swelling, scrotal swelling, testicular pain and urgency.  Neurological: Positive for light-headedness. Negative for dizziness, facial asymmetry, numbness and headaches.  Psychiatric/Behavioral: The patient is not nervous/anxious.        Objective:    Physical Exam Constitutional:      Appearance: Normal appearance. He is normal weight.  HENT:     Head: Normocephalic and atraumatic.     Right Ear: Tympanic membrane and ear canal normal.     Left Ear: Tympanic membrane and ear canal normal.     Nose: Nose normal. No congestion or rhinorrhea.     Mouth/Throat:     Mouth: Mucous membranes are moist.     Pharynx: Oropharynx is clear.  Eyes:     Conjunctiva/sclera: Conjunctivae normal.     Pupils: Pupils are equal, round, and reactive to light.  Cardiovascular:     Rate and Rhythm: Normal rate and regular rhythm.     Pulses: Normal pulses.     Heart sounds: Normal heart sounds.  Pulmonary:     Effort: Pulmonary effort is normal. No respiratory distress.     Breath sounds: Normal breath sounds.  Abdominal:     General: Bowel sounds are normal. There is no distension.     Tenderness: There is no abdominal tenderness.  Musculoskeletal:     Cervical back: Normal range of motion and neck supple. No rigidity or tenderness.  Neurological:     Mental Status: He is alert.     BP 110/82 (BP Location: Left Arm, Patient Position: Sitting)   Pulse 66   Temp (!) 97.5 F (36.4 C) (Temporal)   Resp 18   Ht _0  (1.727 m)   Wt 195 lb (88.5 kg)   BMI 29.65 kg/m  Wt Readings from Last 3 Encounters:  08/18/19 195 lb (88.5 kg)  07/23/19 193 lb 12.8 oz (87.9 kg)  05/28/18 196 lb (88.9 kg)    Health Maintenance Due  Topic Date Due  . HIV Screening  Never done  . TETANUS/TDAP  Never done    There are no preventive care reminders to display for this patient.   No results found for: TSH No results found for: WBC, HGB, HCT, MCV, PLT No results found for: NA, K, CHLORIDE,  CO2, GLUCOSE, BUN, CREATININE, BILITOT, ALKPHOS, AST, ALT, PROT, ALBUMIN, CALCIUM, ANIONGAP, EGFR, GFR No results found for: CHOL No results found for: HDL No results found for: LDLCALC No results found for: TRIG No results found for: CHOLHDL No results found for: HGBA1C     Assessment & Plan:   Problem List Items Addressed This Visit      Genitourinary   Kidney stone    Pain medication, Flomax, increase fluid, Renal ultra sound      Relevant Medications   amphetamine-dextroamphetamine (ADDERALL)  15 MG tablet   tamsulosin (FLOMAX) 0.4 MG CAPS capsule   Other Relevant Orders   US Renal     Other   Frequent urination - Primary    UA with cultures collected      Relevant Orders   POCT urinalysis dipstick (Completed)   Ct, Ng, M genitalium NAA, Swab (Completed)   Urine Culture (Completed)   Pelvic pain    Pain medication. F/u up with worsening symptoms      Relevant Orders   Ct, Ng, M genitalium NAA, Swab (Completed)   Urine Culture (Completed)       Meds ordered this encounter  Medications  . amphetamine-dextroamphetamine (ADDERALL) 15 MG tablet    Sig: Take 1 tablet by mouth every morning.    Dispense:  30 tablet    Refill:  0    Order Specific Question:   Supervising Provider    AnswerRochel Brome [id number]  . HYDROcodone-acetaminophen (NORCO) 10-325 MG tablet    Sig: Take 1 tablet by mouth every 6 (six) hours as needed for up to 3 days.    Dispense:  10 tablet    Refill:  0    Order Specific Question:   Supervising Provider    AnswerRochel Brome [id number]  . tamsulosin (FLOMAX) 0.4 MG CAPS capsule    Sig: Take 1 capsule (0.4 mg total) by mouth daily.    Dispense:  30 capsule    Refill:  0    Order Specific Question:   Supervising Provider    AnswerShelton Silvas     Ivy Lynn, NP

## 2019-08-18 NOTE — Assessment & Plan Note (Signed)
Pain medication. F/u up with worsening symptoms

## 2019-08-18 NOTE — Patient Instructions (Signed)

## 2019-08-20 LAB — URINE CULTURE

## 2019-08-22 LAB — CT, NG, M GENITALIUM NAA, SWAB
Chlamydia trachomatis, NAA: NEGATIVE
Mycoplasma genitalium NAA: NEGATIVE
Neisseria gonorrhoeae, NAA: NEGATIVE

## 2019-09-01 ENCOUNTER — Telehealth: Payer: Self-pay | Admitting: Nurse Practitioner

## 2019-09-01 DIAGNOSIS — N200 Calculus of kidney: Secondary | ICD-10-CM

## 2019-09-01 NOTE — Telephone Encounter (Signed)
Patient notified of renal/urinary tract ultrasound results. Concerning the 2 non obstructive calculi noted the largest measuring 11 mm in the midpole. Everything thing else is within normal limits. Patient notified. He reports continue flank pain, and blood in urine. A urology referral is ordered for follow up.

## 2019-09-04 ENCOUNTER — Telehealth (INDEPENDENT_AMBULATORY_CARE_PROVIDER_SITE_OTHER): Payer: BC Managed Care – PPO | Admitting: Cardiovascular Disease

## 2019-09-04 ENCOUNTER — Other Ambulatory Visit: Payer: Self-pay

## 2019-09-04 ENCOUNTER — Encounter: Payer: Self-pay | Admitting: Cardiovascular Disease

## 2019-09-04 VITALS — BP 128/74 | Ht 68.0 in | Wt 193.0 lb

## 2019-09-04 DIAGNOSIS — R000 Tachycardia, unspecified: Secondary | ICD-10-CM | POA: Diagnosis not present

## 2019-09-04 DIAGNOSIS — I10 Essential (primary) hypertension: Secondary | ICD-10-CM | POA: Diagnosis not present

## 2019-09-04 DIAGNOSIS — N200 Calculus of kidney: Secondary | ICD-10-CM

## 2019-09-04 NOTE — Patient Instructions (Signed)
Medication Instructions:  Your physician recommends that you continue on your current medications as directed. Please refer to the Current Medication list given to you today.  *If you need a refill on your cardiac medications before your next appointment, please call your pharmacy*  Lab Work: NONE  Testing/Procedures: NONE  Follow-Up: At [facility], you and your health needs are our priority.  As part of our continuing mission to provide you with exceptional heart care, we have created designated Provider Care Teams.  These Care Teams include your primary Cardiologist (physician) and Advanced Practice Providers (APPs -  Physician Assistants and Nurse Practitioners) who all work together to provide you with the care you need, when you need it.  We recommend signing up for the patient portal called "MyChart".  Sign up information is provided on this After Visit Summary.  MyChart is used to connect with patients for Virtual Visits (Telemedicine).  Patients are able to view lab/test results, encounter notes, upcoming appointments, etc.  Non-urgent messages can be sent to your provider as well.   To learn more about what you can do with MyChart, go to [email or web address]://[email or web address].com.    Your next appointment:   12 month(s)  You will receive a reminder letter in the mail two months in advance. If you don't receive a letter, please call our office to schedule the follow-up appointment.  The format for your next appointment:   In Person  Provider:   You may see DR Exmore or one of the following Advanced Practice Providers on your designated Care Team:    Luke Kilroy, PA-C  Callie Goodrich, PA-C  Jesse Cleaver, FNP     

## 2019-09-04 NOTE — Progress Notes (Signed)
Virtual Visit via Video Note   This visit type was conducted due to national recommendations for restrictions regarding the COVID-19 Pandemic (e.g. social distancing) in an effort to limit this patient's exposure and mitigate transmission in our community.  Due to his co-morbid illnesses, this patient is at least at moderate risk for complications without adequate follow up.  This format is felt to be most appropriate for this patient at this time.  All issues noted in this document were discussed and addressed.  A limited physical exam was performed with this format.  Please refer to the patient's chart for his consent to telehealth for [facility].   The patient was identified using 2 identifiers.  Date:  09/04/2019   ID:  Travis Dyer, DOB August 03, 1990, MRN [id number]  Patient Location: Home Provider Location: Office  PCP:  Daryll Drown, NP  Cardiologist:  Chilton Si, MD  Electrophysiologist:  None   Evaluation Performed:  Follow-Up Visit  Chief Complaint: Inappropriate sinus tachycardia, hypertension  History of Present Illness:    The patient does not have symptoms concerning for COVID-19 infection (fever, chills, cough, or new shortness of breath).    History of Present Illness: Travis Dyer is a 29 y.o. male with ankylosing spondylitis and inappropriate sinus tachycardia here for follow up.  He was initially seen 09/2017 for chest pain and palpitations.  Mr. Southers has a history of chest pain and shortness of breath dating back to 2016.  He underwent an ETT that was negative for ischemia and he achieved 13 METS on a Bruce protocol.  He also had an echo that was reportedly normal at that time.   He continued to have chest pain and was referred for coronary CT-A 10/2017 that revealed a coronary calcium score of 0 and no coronary artery stenosis.  No coronary anomalies were noted.  He also reported episodes of heart racing.  He wore a 30-day event monitor 09/2017 that showed no  arrhythmias but did show episodes of sinus tachycardia up to the 160s at rest.  He was started on metoprolol prn.  He continued to have some episodes of palpitations that occur sporadically.  He wears a watch that monitors his heart rate and shows that his heart rate is been as fast as the 200s.    At his last appointment Mr. Kalb was struggling with tachycardia and poorly controlled blood pressure.  He struggled with fatigue on metoprolol.  Therefore lisinopril was discontinued and he was started on nebivolol.  Since his last appointment he was treated for kidney stones.  He has been referred to urology and the pain is improving.  He hasn't passed the stone yet.  His blood pressure has been very well have been controlled.  He has not had any issues with palpitations lately.  On average his heart rate seems to be in the 80s.  Rarely when he looks down his heart rate is slightly more elevated but he has not felt any palpitations.  Overall he is feeling well.  He feels much better on nebivolol but other beta-blockers.   Past Medical History:  Diagnosis Date  . Ankylosing spondylitis (HCC) 09/17/2017  . Attention deficit hyperactivity disorder, predominantly inattentive type   . Essential hypertension 09/17/2017  . GERD (gastroesophageal reflux disease) 09/17/2017  . Inappropriate sinus tachycardia 12/07/2017  . Kidney stones 09/17/2017    No past surgical history on file.   Current Outpatient Medications  Medication Sig Dispense Refill  . amphetamine-dextroamphetamine (ADDERALL)  15 MG tablet Take 1 tablet by mouth every morning. 30 tablet 0  . bisoprolol (ZEBETA) 5 MG tablet Take 5 mg by mouth daily.    Marland Kitchen HUMIRA PEN 40 MG/0.4ML PNKT     . pantoprazole (PROTONIX) 40 MG tablet Take 40 mg by mouth daily.    . tamsulosin (FLOMAX) 0.4 MG CAPS capsule Take 0.4 mg by mouth daily as needed.     No current facility-administered medications for this visit.    Allergies:   Patient has no known allergies.     Social History:  The patient  reports that he has never smoked. He has never used smokeless tobacco.   Family History:  The patient's family history includes CAD in his maternal grandfather; Diabetes in his maternal grandmother; Hypertension in his father and mother; Pancreatic cancer in his maternal grandmother; Stroke in his paternal grandfather; Thyroid disease in his sister.    ROS:  Please see the history of present illness.   Otherwise, review of systems are positive for none.   All other systems are reviewed and negative.    PHYSICAL EXAM: VS: BP 128/74   Ht 5\' 8"  (1.727 m)   Wt 193 lb (87.5 kg)   BMI 29.35 kg/m  GENERAL: Well-appearing.  No acute distress. HEENT: Pupils equal round.  Oral mucosa unremarkable NECK:  No jugular venous distention, no visible thyromegaly EXT:  No edema, no cyanosis no clubbing SKIN:  No rashes no nodules NEURO:  Speech fluent.  Cranial nerves grossly intact.  Moves all 4 extremities freely PSYCH:  Cognitively intact, oriented to person place and time   EKG:  EKG is not ordered today. The ekg ordered 09/17/17 demonstrates sinus rhythm.  Rate 77 bpm.  Short PR interval. 05/28/18: Sinus rhythm.  Rate 64 bpm.  Short PR.  07/23/19: Sinus rhythm.  Rate 97 bpm.  Inferior T wave abnormalities.  30 Day Event Monitor 09/2017:  Quality: Fair.  Baseline artifact. Predominant rhythm: sinus rhythm No significant arrhythmias noted.   Palpitations were noted, at which time sinus tachycardia up to 160 bpm was recorded.  Coronary CT-A 11/09/17: IMPRESSION: 1. Coronary artery calcium score 0 Agatston units, suggesting low risk for future cardiac events.  2.  No plaque or stenosis noted in the coronary arteries.   ETT 03/31/16:  Negative, adequate stress test.  Exercised for 10.5 minutes on a Bruce protocol.  13 METS.   Echo (10/17): EF>55%, normal valvular fx, normal RVSF   Recent Labs: No results found for requested labs within last 8760 hours.    11/19/2017: Total cholesterol 132, triglycerides 83, HDL 50, LDL 65 Hemoglobin 14.4, platelets 213 AST 12 TSH 0.7  Lipid Panel No results found for: CHOL, TRIG, HDL, CHOLHDL, VLDL, LDLCALC, LDLDIRECT    Wt Readings from Last 3 Encounters:  09/04/19 193 lb (87.5 kg)  08/18/19 195 lb (88.5 kg)  07/23/19 193 lb 12.8 oz (87.9 kg)      ASSESSMENT AND PLAN:  # Chest pain: Resolved.  Coronary CT-A without CAD.   # Inappropriate sinus tachycardia:  The event monitor revealed inappropriate sinus tachycardia.  Symptoms have been much better controlled on nebivolol.  He did not tolerate metoprolol previously.  # Hypertension: BP stable on nebivolol.  Current medicines are reviewed at length with the patient today.  The patient does not have concerns regarding medicines.  The following changes have been made: Stop lisinopril.  Start nebivolol 10 mg daily.  Labs/ tests ordered today include:   No orders  of the defined types were placed in this encounter.   COVID-19 Education: The signs and symptoms of COVID-19 were discussed with the patient and how to seek care for testing (follow up with PCP or arrange E-visit).  The importance of social distancing was discussed today.  Time:   Today, I have spent 16 minutes with the patient with telehealth technology discussing the above problems.    Disposition:   FU with Dayan Desa C. Duke Salvia, MD, [facility] in 1 year   Signed, Genieve Ramaswamy C. Duke Salvia, MD, [facility]  09/04/2019 11:16 AM    [facility]

## 2019-10-04 IMAGING — CT CT HEART MORP W/ CTA COR W/ SCORE W/ CA W/CM &/OR W/O CM
4 of 7 series · 8 of 20 positions shown, 9 images · IV contrast (APPLIED)
Comparison: None.

CLINICAL DATA: Chest pain

EXAM:
Cardiac CTA
MEDICATIONS:
Sub lingual nitro. 4mg x 2 and lopressor 5mg IV
TECHNIQUE: The patient was scanned on a Siemens [REDACTED]ice scanner. Gantry
rotation speed was 250 msecs. Collimation was 0.6 mm. A 100 kV
prospective scan was triggered in the ascending thoracic aorta at
35-75% of the R-R interval. Average HR during the scan was 60 bpm.
The 3D data set was interpreted on a dedicated work station using
MPR, MIP and VRT modes. A total of 80cc of contrast was used.

[Series 6: best diast 76 % · axial · 0.35mm/px · z∈[+1116,+1162]mm · 2 of 346 slices shown, 3 images]
[im 116/346  vessel]
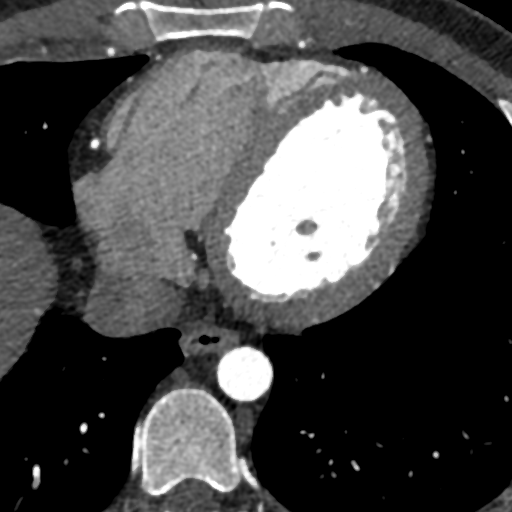
[im 116/346  lung]
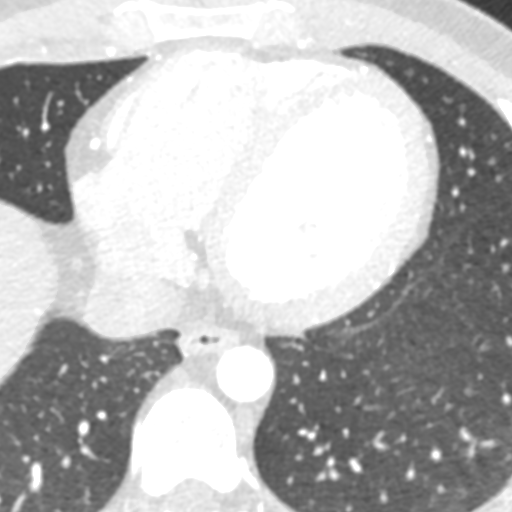
[im 231/346  vessel]
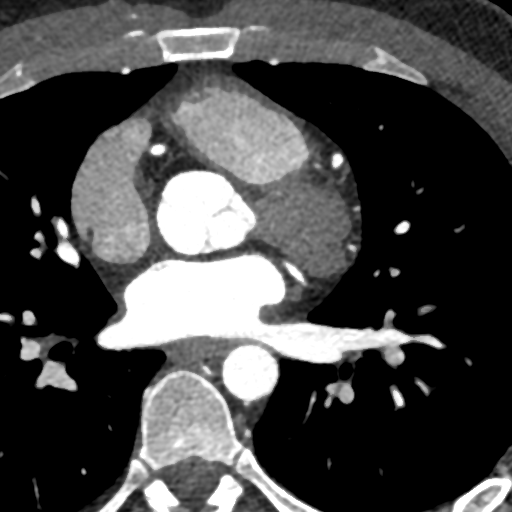

[Series 7: best syst 39 % · axial · 0.35mm/px · z∈[+1116,+1162]mm · 2 of 346 slices shown]
[im 116/346  vessel]
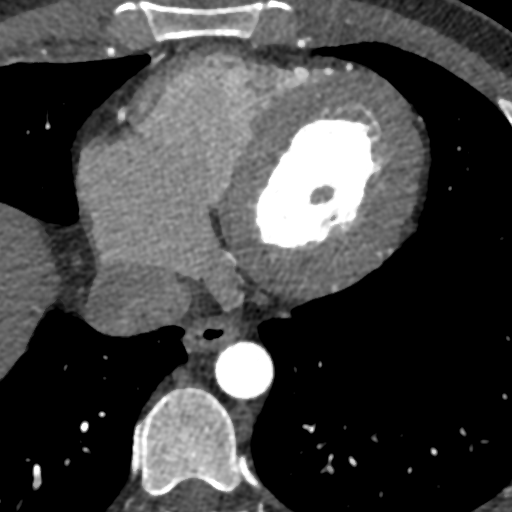
[im 231/346  vessel]
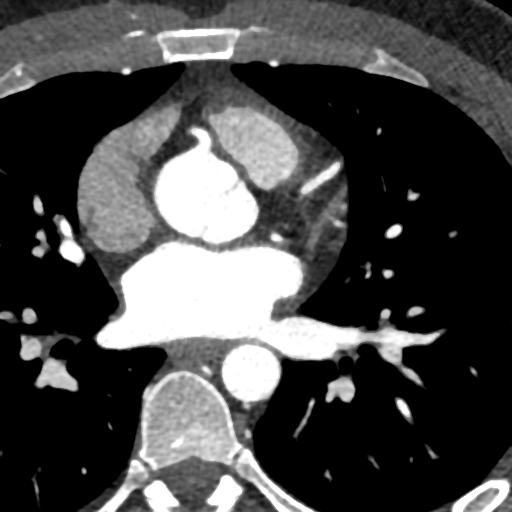

[Series 8: ts diast sharp 76 % · axial · 0.35mm/px · z∈[+1116,+1162]mm · 2 of 346 slices shown]
[im 116/346  lung]
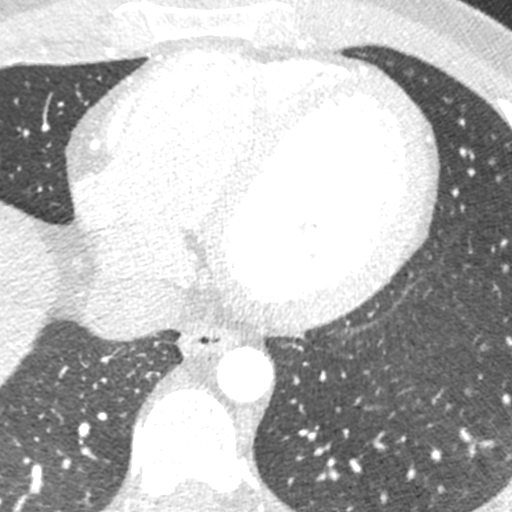
[im 231/346  lung]
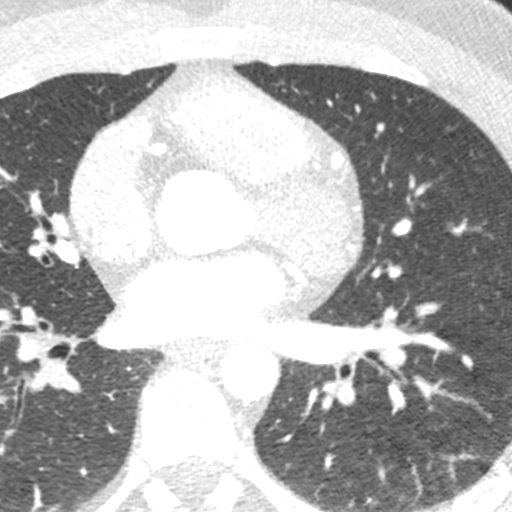

[Series 9: ts syst sharp 39 % · axial · 0.35mm/px · z∈[+1116,+1162]mm · 2 of 346 slices shown]
[im 116/346  lung]
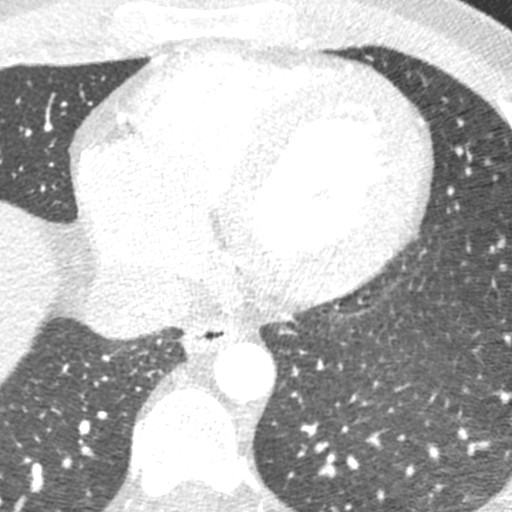
[im 231/346  lung]
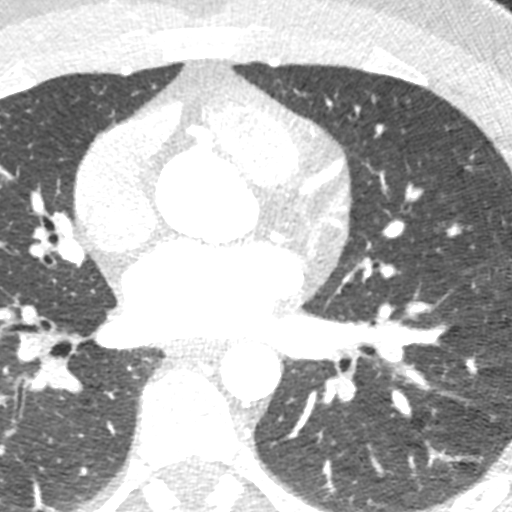

[8 of 20 positions shown; findings below may reference images not displayed]

FINDINGS: Non-cardiac: See separate report from [REDACTED].

Calcium Score: 0 Agatston units.

Coronary Arteries: Right dominant with no anomalies

LM: No plaque or stenosis.

LAD system: No plaque or stenosis.

Circumflex system: No plaque or stenosis.

RCA system: No plaque or stenosis.
IMPRESSION: 1. Coronary artery calcium score 0 Agatston units, suggesting low
risk for future cardiac events.

2.  No plaque or stenosis noted in the coronary arteries.

[place]

EXAM:
OVER-READ INTERPRETATION  CT CHEST

The following report is an over-read performed by radiologist Dr.
Ndc Believe [REDACTED] on 11/09/2017. This
over-read does not include interpretation of cardiac or coronary
anatomy or pathology. The coronary calcium score/coronary CTA
interpretation by the cardiologist is attached.
FINDINGS: Within the visualized portions of the thorax there are no suspicious
appearing pulmonary nodules or masses, there is no acute
consolidative airspace disease, no pleural effusions, no
pneumothorax and no lymphadenopathy. Visualized portions of the
upper abdomen are unremarkable. There are no aggressive appearing
lytic or blastic lesions noted in the visualized portions of the
skeleton.
IMPRESSION: 1. No significant incidental noncardiac findings are noted.

## 2019-10-06 ENCOUNTER — Telehealth (INDEPENDENT_AMBULATORY_CARE_PROVIDER_SITE_OTHER): Payer: BC Managed Care – PPO | Admitting: Legal Medicine

## 2019-10-06 ENCOUNTER — Encounter: Payer: Self-pay | Admitting: Legal Medicine

## 2019-10-06 VITALS — BP 135/84 | Temp 97.4°F

## 2019-10-06 DIAGNOSIS — J180 Bronchopneumonia, unspecified organism: Secondary | ICD-10-CM | POA: Diagnosis not present

## 2019-10-06 MED ORDER — AZITHROMYCIN 250 MG PO TABS: ORAL_TABLET | ORAL | 0 refills | Status: DC

## 2019-10-06 MED ORDER — PREDNISONE 10 MG (21) PO TBPK
ORAL_TABLET | ORAL | 0 refills | Status: DC
Start: 2019-10-06 — End: 2020-07-08

## 2019-10-06 NOTE — Assessment & Plan Note (Signed)
Patient is having cough and productive of green phleg.  He is congested.  Sent in zithromax and prednisone pack.  Follow up PRN

## 2019-10-06 NOTE — Progress Notes (Signed)
Virtual Visit via Telephone Note   This visit type was conducted due to national recommendations for restrictions regarding the COVID-19 Pandemic (e.g. social distancing) in an effort to limit this patient's exposure and mitigate transmission in our community.  Due to his co-morbid illnesses, this patient is at least at moderate risk for complications without adequate follow up.  This format is felt to be most appropriate for this patient at this time.  The patient did not have access to video technology/had technical difficulties with video requiring transitioning to audio format only (telephone).  All issues noted in this document were discussed and addressed.  No physical exam could be performed with this format.  Patient verbally consented to a telehealth visit.   Date:  10/06/2019   ID:  Travis Dyer, DOB [birth year], MRN [id number]  Patient Location: Home Provider Location: Office  PCP:  Abigail Miyamoto, MD   Evaluation Performed:  New Patient Evaluation  Chief Complaint:  Allergies, cough  History of Present Illness:    Travis Dyer is a 29 y.o. male with cough productive green.  He gets dyspneic but no history of asthma.  His sinuses are congested and sore.  The patient does not have symptoms concerning for COVID-19 infection (fever, chills, cough, or new shortness of breath).    Past Medical History:  Diagnosis Date  . Ankylosing spondylitis (HCC) 09/17/2017  . Attention deficit hyperactivity disorder, predominantly inattentive type   . Essential hypertension 09/17/2017  . GERD (gastroesophageal reflux disease) 09/17/2017  . Inappropriate sinus tachycardia 12/07/2017  . Kidney stones 09/17/2017   No past surgical history on file.  Family History  Problem Relation Age of Onset  . Hypertension Mother   . Hypertension Father   . Thyroid disease Sister   . Pancreatic cancer Maternal Grandmother   . Diabetes Maternal Grandmother   . CAD Maternal Grandfather   . Stroke  Paternal Grandfather     Current Meds  Medication Sig  . amphetamine-dextroamphetamine (ADDERALL) 15 MG tablet Take 1 tablet by mouth every morning.  . bisoprolol (ZEBETA) 5 MG tablet Take 5 mg by mouth daily.  Marland Kitchen HUMIRA PEN 40 MG/0.4ML PNKT   . pantoprazole (PROTONIX) 40 MG tablet Take 40 mg by mouth daily.  . tamsulosin (FLOMAX) 0.4 MG CAPS capsule Take 0.4 mg by mouth daily as needed.     Allergies:   Patient has no known allergies.   Social History   Tobacco Use  . Smoking status: Never Smoker  . Smokeless tobacco: Never Used  Substance Use Topics  . Alcohol use: Not on file  . Drug use: Not on file     Family Hx: The patient's family history includes CAD in his maternal grandfather; Diabetes in his maternal grandmother; Hypertension in his father and mother; Pancreatic cancer in his maternal grandmother; Stroke in his paternal grandfather; Thyroid disease in his sister.  ROS:   Please see the history of present illness.    Review of Systems  Constitutional: Positive for malaise/fatigue.  HENT: Positive for congestion.   Eyes: Negative.   Respiratory: Positive for cough and sputum production.   Cardiovascular: Negative.   Gastrointestinal: Negative.   Genitourinary: Negative.   Neurological: Negative.    All other systems reviewed and are negative.  Labs/Other Tests and Data Reviewed:    Recent Labs: No results found for requested labs within last 8760 hours.   Recent Lipid Panel No results found for: CHOL, TRIG, HDL, CHOLHDL, LDLCALC, LDLDIRECT  Wt  Readings from Last 3 Encounters:  09/04/19 193 lb (87.5 kg)  08/18/19 195 lb (88.5 kg)  07/23/19 193 lb 12.8 oz (87.9 kg)     Objective:    Vital Signs:  BP 135/84   Temp (!) 97.4 F (36.3 C)    VITAL SIGNS:  reviewed  ASSESSMENT & PLAN:    Problem List Items Addressed This Visit      Respiratory   Bronchopneumonia - Primary    Patient is having cough and productive of green phleg.  He is congested.   Sent in zithromax and prednisone pack.  Follow up PRN      Relevant Medications   azithromycin (ZITHROMAX) 250 MG tablet   predniSONE (STERAPRED UNI-PAK 21 TAB) 10 MG (21) TBPK tablet      Bronchopneumonia Patient is having cough and productive of green phleg.  He is congested.  Sent in zithromax and prednisone pack.  Follow up PRN   Meds ordered this encounter  Medications  . azithromycin (ZITHROMAX) 250 MG tablet    Sig: 2 tablets on day 1, then 1 tablet daily on days 2-6.    Dispense:  6 tablet    Refill:  0  . predniSONE (STERAPRED UNI-PAK 21 TAB) 10 MG (21) TBPK tablet    Sig: Take 6ills first day , then 5 pills day 2 and then cut down one pill day until gone    Dispense:  21 tablet    Refill:  0    COVID-19 Education: The signs and symptoms of COVID-19 were discussed with the patient and how to seek care for testing (follow up with PCP or arrange E-visit). The importance of social distancing was discussed today.  Time:   Today, I have spent 20 minutes with the patient with telehealth technology discussing the above problems.     Medication Adjustments/Labs and Tests Ordered: Current medicines are reviewed at length with the patient today.  Concerns regarding medicines are outlined above.   Tests Ordered: No orders of the defined types were placed in this encounter.   Medication Changes: Meds ordered this encounter  Medications  . azithromycin (ZITHROMAX) 250 MG tablet    Sig: 2 tablets on day 1, then 1 tablet daily on days 2-6.    Dispense:  6 tablet    Refill:  0  . predniSONE (STERAPRED UNI-PAK 21 TAB) 10 MG (21) TBPK tablet    Sig: Take 6ills first day , then 5 pills day 2 and then cut down one pill day until gone    Dispense:  21 tablet    Refill:  0  Return if symptoms worsen or fail to improve.   Follow Up:  In Person prn  Signed, Reinaldo Meeker, MD  10/06/2019 10:41 AM    [place]

## 2019-10-28 ENCOUNTER — Other Ambulatory Visit: Payer: Self-pay

## 2019-10-28 DIAGNOSIS — N200 Calculus of kidney: Secondary | ICD-10-CM

## 2019-10-28 MED ORDER — AMPHETAMINE-DEXTROAMPHETAMINE 15 MG PO TABS: 15.0000 mg | ORAL_TABLET | Freq: Every morning | ORAL | 0 refills | Status: DC

## 2020-02-09 ENCOUNTER — Other Ambulatory Visit: Payer: Self-pay

## 2020-02-09 DIAGNOSIS — N200 Calculus of kidney: Secondary | ICD-10-CM

## 2020-02-09 MED ORDER — AMPHETAMINE-DEXTROAMPHETAMINE 15 MG PO TABS: 15.0000 mg | ORAL_TABLET | Freq: Every morning | ORAL | 0 refills | Status: DC

## 2020-02-09 NOTE — Telephone Encounter (Signed)
Patient has been scheduled for Thursday to establish with you and was last seen by Dr. Marina Goodell for a acute and med follow up was done with Je. Patient was previously Andi's patient so gave patient . Patient is requesting refill on Adderall to be sent to [facility]. [place]

## 2020-02-12 ENCOUNTER — Other Ambulatory Visit: Payer: Self-pay

## 2020-02-12 ENCOUNTER — Ambulatory Visit: Payer: BC Managed Care – PPO | Admitting: Physician Assistant

## 2020-02-24 ENCOUNTER — Ambulatory Visit: Payer: Self-pay | Admitting: Physician Assistant

## 2020-04-14 ENCOUNTER — Other Ambulatory Visit: Payer: Self-pay | Admitting: Cardiovascular Disease

## 2020-04-19 ENCOUNTER — Other Ambulatory Visit: Payer: Self-pay

## 2020-04-19 MED ORDER — BISOPROLOL FUMARATE 5 MG PO TABS: 5.0000 mg | ORAL_TABLET | Freq: Every day | ORAL | 2 refills | Status: DC

## 2020-05-21 ENCOUNTER — Other Ambulatory Visit: Payer: Self-pay | Admitting: Family Medicine

## 2020-07-07 ENCOUNTER — Other Ambulatory Visit: Payer: Self-pay | Admitting: Physician Assistant

## 2020-07-08 ENCOUNTER — Ambulatory Visit: Payer: Commercial Managed Care - PPO | Admitting: Legal Medicine

## 2020-07-08 ENCOUNTER — Encounter: Payer: Self-pay | Admitting: Legal Medicine

## 2020-07-08 ENCOUNTER — Other Ambulatory Visit: Payer: Self-pay

## 2020-07-08 VITALS — BP 128/80 | HR 74 | Temp 97.2°F | Ht 68.0 in | Wt 197.8 lb

## 2020-07-08 DIAGNOSIS — I10 Essential (primary) hypertension: Secondary | ICD-10-CM

## 2020-07-08 DIAGNOSIS — K219 Gastro-esophageal reflux disease without esophagitis: Secondary | ICD-10-CM | POA: Diagnosis not present

## 2020-07-08 DIAGNOSIS — Z23 Encounter for immunization: Secondary | ICD-10-CM

## 2020-07-08 DIAGNOSIS — N200 Calculus of kidney: Secondary | ICD-10-CM

## 2020-07-08 DIAGNOSIS — M459 Ankylosing spondylitis of unspecified sites in spine: Secondary | ICD-10-CM | POA: Diagnosis not present

## 2020-07-08 DIAGNOSIS — F988 Other specified behavioral and emotional disorders with onset usually occurring in childhood and adolescence: Secondary | ICD-10-CM | POA: Diagnosis not present

## 2020-07-08 LAB — POCT URINALYSIS DIP (CLINITEK)
Bilirubin, UA: NEGATIVE
Glucose, UA: NEGATIVE mg/dL
Ketones, POC UA: NEGATIVE mg/dL
Leukocytes, UA: NEGATIVE
Nitrite, UA: NEGATIVE
Spec Grav, UA: >=1.03 — AB (ref 1.010–1.025)
Urobilinogen, UA: 0.2 E.U./dL
pH, UA: 6 (ref 5.0–8.0)

## 2020-07-08 MED ORDER — AMPHETAMINE-DEXTROAMPHETAMINE 15 MG PO TABS: 15.0000 mg | ORAL_TABLET | Freq: Every morning | ORAL | 0 refills | Status: DC

## 2020-07-08 MED ORDER — PANTOPRAZOLE SODIUM 40 MG PO TBEC: 40.0000 mg | DELAYED_RELEASE_TABLET | Freq: Every day | ORAL | 2 refills | Status: DC

## 2020-07-08 NOTE — Patient Instructions (Signed)
Preventing Hypoglycemia Hypoglycemia occurs when the level of sugar (glucose) in the blood is too low. Hypoglycemia can happen in people who do or do not have diabetes (diabetes mellitus). It can develop quickly, and it can be a medical emergency. For most people with diabetes, a blood glucose level below 70 mg/dL (3.9 mmol/L) is considered hypoglycemia. Glucose is a type of sugar that provides the body's main source of energy. Certain hormones (insulin and glucagon) control the level of glucose in the blood. Insulin lowers blood glucose, and glucagon increases blood glucose. Hypoglycemia can result from having too much insulin in the bloodstream, or from not eating enough food that contains glucose. Your risk for hypoglycemia is higher:  If you take insulin or diabetes medicines to help lower your blood glucose or help your body make more insulin.  If you skip or delay a meal or snack.  If you are ill.  During and after exercise. You can prevent hypoglycemia by working with your health care provider to adjust your meal plan as needed and by taking other precautions. How can hypoglycemia affect me? Mild symptoms Mild hypoglycemia may not cause any symptoms. If you do have symptoms, they may include:  Hunger.  Anxiety.  Sweating and feeling clammy.  Dizziness or feeling light-headed.  Sleepiness.  Nausea.  Increased heart rate.  Headache.  Blurry vision.  Irritability.  Tingling or numbness around the mouth, lips, or tongue.  A change in coordination.  Restless sleep. If mild hypoglycemia is not recognized and treated, it can quickly become moderate or severe hypoglycemia. Moderate symptoms Moderate hypoglycemia can cause:  Mental confusion and poor judgment.  Behavior changes.  Weakness.  Irregular heartbeat. Severe symptoms Severe hypoglycemia is a medical emergency. It can cause:  Fainting.  Seizures.  Loss of consciousness (coma).  Death. What  nutrition changes can be made?  Work with your health care provider or diet and nutrition specialist (dietitian) to make a healthy meal plan that is right for you. Follow your meal plan carefully.  Eat meals at regular times.  If recommended by your health care provider, have snacks between meals.  Donot skip or delay meals or snacks. You can be at risk for hypoglycemia if you are not getting enough carbohydrates. What lifestyle changes can be made?  Work closely with your health care provider to manage your blood glucose. Make sure you know: ? Your goal blood glucose levels. ? How and when to check your blood glucose. ? The symptoms of hypoglycemia. It is important to treat it right away to keep it from becoming severe.  Do not drink alcohol on an empty stomach.  When you are ill, check your blood glucose more often than usual. Follow your sick day plan whenever you cannot eat or drink normally. Make this plan in advance with your health care provider.  Always check your blood glucose before, during, and after exercise.   How is this treated? This condition can often be treated by immediately eating or drinking something that contains sugar, such as:  Fruit juice, 4-6 oz (120-150 mL).  Regular (not diet) soda, 4-6 oz (120-150 mL).  Low-fat milk, 4 oz (120 mL).  Several pieces of hard candy.  Sugar or honey, 1 Tbsp (15 mL). Treating hypoglycemia if you have diabetes If you are alert and able to swallow safely, follow the 15:15 rule:  Take 15 grams of a rapid-acting carbohydrate. Talk with your health care provider about how much you should take.  Rapid-acting  options include: ? Glucose pills (take 15 grams). ? 6-8 pieces of hard candy. ? 4-6 oz (120-150 mL) of fruit juice. ? 4-6 oz (120-150 mL) of regular (not diet) soda.  Check your blood glucose 15 minutes after you take the carbohydrate.  If the repeat blood glucose level is still at or below 70 mg/dL (3.9 mmol/L),  take 15 grams of a carbohydrate again.  If your blood glucose level does not increase above 70 mg/dL (3.9 mmol/L) after 3 tries, seek emergency medical care.  After your blood glucose level returns to normal, eat a meal or a snack within 1 hour. Treating severe hypoglycemia Severe hypoglycemia is when your blood glucose level is at or below 54 mg/dL (3 mmol/L). Severe hypoglycemia is a medical emergency. Get medical help right away. If you have severe hypoglycemia and you cannot eat or drink, you may need an injection of glucagon. A family member or close friend should learn how to check your blood glucose and how to give you a glucagon injection. Ask your health care provider if you need to have an emergency glucagon injection kit available. Severe hypoglycemia may need to be treated in a hospital. The treatment may include getting glucose through an IV. You may also need treatment for the cause of your hypoglycemia. Where to find more information  American Diabetes Association: www.diabetes.[employer] of Diabetes and Digestive and Kidney Diseases: [employer] Contact a health care provider if:  You have problems keeping your blood glucose in your target range.  You have frequent episodes of hypoglycemia. Get help right away if:  You continue to have hypoglycemia symptoms after eating or drinking something containing glucose.  Your blood glucose level is at or below 54 mg/dL (3 mmol/L).  You faint.  You have a seizure. These symptoms may represent a serious problem that is an emergency. Do not wait to see if the symptoms will go away. Get medical help right away. Call your local emergency services (911 in the [place] Summary  Know the symptoms of hypoglycemia, and when you are at risk for it (such as during exercise or when you are sick). Check your blood glucose often when you are at risk for hypoglycemia.  Hypoglycemia can develop quickly, and it can be dangerous  if it is not treated right away. If you have a history of severe hypoglycemia, make sure you know how to use your glucagon injection kit.  Make sure you know how to treat hypoglycemia. Keep a carbohydrate snack available when you may be at risk for hypoglycemia. This information is not intended to replace advice given to you by your health care provider. Make sure you discuss any questions you have with your health care provider. Document Revised: 09/27/2018 Document Reviewed: 01/31/2017 Elsevier Patient Education  2021 [employer].

## 2020-07-08 NOTE — Progress Notes (Signed)
Subjective:  Patient ID: Travis Dyer, male    DOB: 08-Dec-1990  Age: 30 y.o. MRN: [id number]  Chief Complaint  Patient presents with  . Hypertension    HPI: first visit.  He has anklosing spondylitis and seeing [facility] rheumatology and on [place].-5 years.    Patient presents for follow up of hypertension.  Patient tolerating bisoprolol well with side effects.  Patient was diagnosed with hypertension 2010 so has been treated for hypertension for 10 years.Patient is working on maintaining diet and exercise regimen and follows up as directed. Complication include none  ADD possibly from closed head injuries.  He memory is better.  Doing better on adderall. He never had a workup.   Patient has gastroesophageal reflux symptoms withesophagitis and LTRD.  The symptoms are mild intensity.  Length of symptoms 10 years.  Medicines include pantoprazole.  Complications include none. Current Outpatient Medications on File Prior to Visit  Medication Sig Dispense Refill  . bisoprolol (ZEBETA) 5 MG tablet Take 1 tablet (5 mg total) by mouth daily. 90 tablet 2  . HUMIRA PEN 40 MG/0.4ML PNKT     . ondansetron (ZOFRAN-ODT) 4 MG disintegrating tablet Take 4 mg by mouth every 6 (six) hours as needed.    . tamsulosin (FLOMAX) 0.4 MG CAPS capsule Take 0.4 mg by mouth daily as needed.     No current facility-administered medications on file prior to visit.   Past Medical History:  Diagnosis Date  . Ankylosing spondylitis (HCC) 09/17/2017  . Attention deficit hyperactivity disorder, predominantly inattentive type   . Essential hypertension 09/17/2017  . GERD (gastroesophageal reflux disease) 09/17/2017  . Inappropriate sinus tachycardia 12/07/2017  . Kidney stones 09/17/2017   History reviewed. No pertinent surgical history.  Family History  Problem Relation Age of Onset  . Hypertension Mother   . Hypertension Father   . Thyroid disease Sister   . Pancreatic cancer Maternal Grandmother   . Diabetes  Maternal Grandmother   . CAD Maternal Grandfather   . Stroke Paternal Grandfather    Social History   Socioeconomic History  . Marital status: Single    Spouse name: Not on file  . Number of children: Not on file  . Years of education: Not on file  . Highest education level: Not on file  Occupational History  . Not on file  Tobacco Use  . Smoking status: Never Smoker  . Smokeless tobacco: Never Used  Substance and Sexual Activity  . Alcohol use: Not on file  . Drug use: Not on file  . Sexual activity: Not on file  Other Topics Concern  . Not on file  Social History Narrative  . Not on file   Social Determinants of Health   Financial Resource Strain: Not on file  Food Insecurity: Not on file  Transportation Needs: Not on file  Physical Activity: Not on file  Stress: Not on file  Social Connections: Not on file    Review of Systems  Constitutional: Negative for activity change and chills.  HENT: Negative for congestion and dental problem.   Eyes: Negative for visual disturbance.  Respiratory: Negative for apnea and wheezing.   Cardiovascular: Negative for chest pain, palpitations and leg swelling.  Gastrointestinal: Negative for abdominal distention.  Endocrine: Negative for polyuria.  Genitourinary: Negative for difficulty urinating and dysuria.  Musculoskeletal: Negative for arthralgias and back pain.  Neurological: Negative for dizziness and numbness.  Psychiatric/Behavioral: Negative for agitation and behavioral problems.     Objective:  BP  128/80 (BP Location: Right Arm, Patient Position: Sitting, Cuff Size: Normal)   Pulse 74   Temp (!) 97.2 F (36.2 C) (Temporal)   Ht 5\' 8"  (1.727 m)   Wt 197 lb 12.8 oz (89.7 kg)   SpO2 98%   BMI 30.08 kg/m   BP/Weight 07/08/2020 10/06/2019 09/04/2019  Systolic BP 128 135 128  Diastolic BP 80 84 74  Wt. (Lbs) 197.8 - 193  BMI 30.08 - 29.35    Physical Exam Vitals reviewed.  Constitutional:      General: He is  not in acute distress.    Appearance: Normal appearance. He is obese.  HENT:     Right Ear: Tympanic membrane, ear canal and external ear normal.     Left Ear: Tympanic membrane, ear canal and external ear normal.     Mouth/Throat:     Mouth: Mucous membranes are moist.     Pharynx: Oropharynx is clear.  Eyes:     Extraocular Movements: Extraocular movements intact.     Conjunctiva/sclera: Conjunctivae normal.     Pupils: Pupils are equal, round, and reactive to light.  Cardiovascular:     Rate and Rhythm: Normal rate and regular rhythm.     Pulses: Normal pulses.     Heart sounds: Normal heart sounds.  Pulmonary:     Effort: Pulmonary effort is normal. No respiratory distress.  Abdominal:     General: Abdomen is flat. Bowel sounds are normal. There is no distension.     Palpations: Abdomen is soft.     Tenderness: There is no abdominal tenderness.  Musculoskeletal:        General: Normal range of motion.     Cervical back: Normal range of motion and neck supple.  Skin:    General: Skin is warm and dry.     Capillary Refill: Capillary refill takes less than 2 seconds.  Neurological:     General: No focal deficit present.     Mental Status: He is alert and oriented to person, place, and time.  Psychiatric:        Mood and Affect: Mood normal.        Thought Content: Thought content normal.        Judgment: Judgment normal.       No results found for: WBC, HGB, HCT, PLT, GLUCOSE, CHOL, TRIG, HDL, LDLDIRECT, LDLCALC, ALT, AST, NA, K, CL, CREATININE, BUN, CO2, TSH, PSA, INR, GLUF, HGBA1C, MICROALBUR    Assessment & Plan:   Diagnoses and all orders for this visit: Essential hypertension -     CBC with Differential/Platelet -     Comprehensive metabolic panel An individual hypertension care plan was established and reinforced today.  The patient's status was assessed using clinical findings on exam and labs or diagnostic tests. The patient's success at meeting treatment  goals on disease specific evidence-based guidelines and found to be poor controlled. Start medicines.  Dash diet SELF MANAGEMENT: The patient and I together assessed ways to personally work towards obtaining the recommended goals. RECOMMENDATIONS: avoid decongestants found in common cold remedies, decrease consumption of alcohol, perform routine monitoring of BP with home BP cuff, exercise, reduction of dietary salt, take medicines as prescribed, try not to miss doses and quit smoking.  Regular exercise and maintaining a healthy weight is needed.  Stress reduction may help. A CLINICAL SUMMARY including written plan identify barriers to care unique to individual due to social or financial issues.  We attempt to mutually creat solutions for  individual and family understanding.  Ankylosing spondylitis, unspecified site of spine (HCC) -     POCT URINALYSIS DIP (CLINITEK) Long history of anklyosis spondylitis and seeing rheumatology on Humera  Gastroesophageal reflux disease, unspecified whether esophagitis present -     pantoprazole (PROTONIX) 40 MG tablet; Take 1 tablet (40 mg total) by mouth daily. Plan of care was formulated today.  She is doing well.  A plan of care was formulated using patient exam, tests and other sources to optimize care using evidence based information.  Recommend no smoking, no eating after supper, avoid fatty foods, elevate Head of bed, avoid tight fitting clothing.  Continue on oantoprazole.  Attention deficit disorder, unspecified hyperactivity presence History of ADD probably from multiple concussions.    Kidney stone -     amphetamine-dextroamphetamine (ADDERALL) 15 MG tablet; Take 1 tablet by mouth every morning. Patient has history of renal calculus  Need for vaccination -     Tdap vaccine greater than or equal to 7yo IM -     Flu Vaccine QUAD 36+ mos IM          I spent 30 minutes dedicated to the care of this patient on the date of this encounter to  include face-to-face time with the patient, as well as:   Follow-up: Return in about 3 months (around 10/06/2020).  An After Visit Summary was printed and given to the patient.  Brent Bulla, MD [facility] [phone]

## 2020-07-09 LAB — CBC WITH DIFFERENTIAL/PLATELET
Basophils Absolute: 0 10*3/uL (ref 0.0–0.2)
Basos: 1 %
EOS (ABSOLUTE): 0.1 10*3/uL (ref 0.0–0.4)
Eos: 1 %
Hematocrit: 40.4 % (ref 37.5–51.0)
Hemoglobin: 14.1 g/dL (ref 13.0–17.7)
Immature Grans (Abs): 0 10*3/uL (ref 0.0–0.1)
Immature Granulocytes: 0 %
Lymphocytes Absolute: 3.4 10*3/uL — ABNORMAL HIGH (ref 0.7–3.1)
Lymphs: 47 %
MCH: 30.1 pg (ref 26.6–33.0)
MCHC: 34.9 g/dL (ref 31.5–35.7)
MCV: 86 fL (ref 79–97)
Monocytes Absolute: 0.6 10*3/uL (ref 0.1–0.9)
Monocytes: 8 %
Neutrophils Absolute: 3.2 10*3/uL (ref 1.4–7.0)
Neutrophils: 43 %
Platelets: 258 10*3/uL (ref 150–450)
RBC: 4.69 x10E6/uL (ref 4.14–5.80)
RDW: 12.4 % (ref 11.6–15.4)
WBC: 7.4 10*3/uL (ref 3.4–10.8)

## 2020-07-09 LAB — COMPREHENSIVE METABOLIC PANEL
ALT: 26 IU/L (ref 0–44)
AST: 17 IU/L (ref 0–40)
Albumin/Globulin Ratio: 1.7 (ref 1.2–2.2)
Albumin: 4.4 g/dL (ref 4.1–5.2)
Alkaline Phosphatase: 73 IU/L (ref 44–121)
BUN/Creatinine Ratio: 13 (ref 9–20)
BUN: 13 mg/dL (ref 6–20)
Bilirubin Total: 0.4 mg/dL (ref 0.0–1.2)
CO2: 23 mmol/L (ref 20–29)
Calcium: 9.5 mg/dL (ref 8.7–10.2)
Chloride: 105 mmol/L (ref 96–106)
Creatinine, Ser: 1 mg/dL (ref 0.76–1.27)
GFR calc Af Amer: 117 mL/min/{1.73_m2} (ref >59)
GFR calc non Af Amer: 101 mL/min/{1.73_m2} (ref >59)
Globulin, Total: 2.6 g/dL (ref 1.5–4.5)
Glucose: 90 mg/dL (ref 65–99)
Potassium: 4.2 mmol/L (ref 3.5–5.2)
Sodium: 143 mmol/L (ref 134–144)
Total Protein: 7 g/dL (ref 6.0–8.5)

## 2020-07-09 NOTE — Progress Notes (Signed)
CBC normal, kidney and liver tests normal °lp

## 2020-08-23 ENCOUNTER — Other Ambulatory Visit: Payer: Self-pay | Admitting: Legal Medicine

## 2020-08-23 DIAGNOSIS — N200 Calculus of kidney: Secondary | ICD-10-CM

## 2020-08-23 MED ORDER — AMPHETAMINE-DEXTROAMPHETAMINE 15 MG PO TABS: 15.0000 mg | ORAL_TABLET | Freq: Every morning | ORAL | 0 refills | Status: DC

## 2020-09-28 ENCOUNTER — Other Ambulatory Visit: Payer: Self-pay | Admitting: Legal Medicine

## 2020-09-28 DIAGNOSIS — N200 Calculus of kidney: Secondary | ICD-10-CM

## 2020-10-08 ENCOUNTER — Ambulatory Visit: Payer: Commercial Managed Care - PPO | Admitting: Legal Medicine

## 2020-10-12 ENCOUNTER — Other Ambulatory Visit: Payer: Self-pay

## 2020-10-12 ENCOUNTER — Encounter: Payer: Self-pay | Admitting: Legal Medicine

## 2020-10-12 ENCOUNTER — Ambulatory Visit (INDEPENDENT_AMBULATORY_CARE_PROVIDER_SITE_OTHER): Payer: Commercial Managed Care - PPO | Admitting: Legal Medicine

## 2020-10-12 VITALS — BP 116/64 | HR 81 | Temp 97.3°F | Ht 68.0 in | Wt 191.0 lb

## 2020-10-12 DIAGNOSIS — M459 Ankylosing spondylitis of unspecified sites in spine: Secondary | ICD-10-CM

## 2020-10-12 DIAGNOSIS — F988 Other specified behavioral and emotional disorders with onset usually occurring in childhood and adolescence: Secondary | ICD-10-CM | POA: Diagnosis not present

## 2020-10-12 DIAGNOSIS — I10 Essential (primary) hypertension: Secondary | ICD-10-CM

## 2020-10-12 DIAGNOSIS — K219 Gastro-esophageal reflux disease without esophagitis: Secondary | ICD-10-CM

## 2020-10-12 LAB — COMPREHENSIVE METABOLIC PANEL
ALT: 15 IU/L (ref 0–44)
AST: 14 IU/L (ref 0–40)
Albumin/Globulin Ratio: 1.8 (ref 1.2–2.2)
Albumin: 4.5 g/dL (ref 4.1–5.2)
Alkaline Phosphatase: 78 IU/L (ref 44–121)
BUN/Creatinine Ratio: 18 (ref 9–20)
BUN: 16 mg/dL (ref 6–20)
Bilirubin Total: 0.6 mg/dL (ref 0.0–1.2)
CO2: 22 mmol/L (ref 20–29)
Calcium: 9.5 mg/dL (ref 8.7–10.2)
Chloride: 103 mmol/L (ref 96–106)
Creatinine, Ser: 0.91 mg/dL (ref 0.76–1.27)
Globulin, Total: 2.5 g/dL (ref 1.5–4.5)
Glucose: 91 mg/dL (ref 65–99)
Potassium: 4.5 mmol/L (ref 3.5–5.2)
Sodium: 140 mmol/L (ref 134–144)
Total Protein: 7 g/dL (ref 6.0–8.5)
eGFR: 117 mL/min/{1.73_m2} (ref >59)

## 2020-10-12 LAB — CBC WITH DIFFERENTIAL/PLATELET
Basophils Absolute: 0 10*3/uL (ref 0.0–0.2)
Basos: 1 %
EOS (ABSOLUTE): 0.1 10*3/uL (ref 0.0–0.4)
Eos: 1 %
Hematocrit: 43.3 % (ref 37.5–51.0)
Hemoglobin: 14.7 g/dL (ref 13.0–17.7)
Immature Grans (Abs): 0 10*3/uL (ref 0.0–0.1)
Immature Granulocytes: 0 %
Lymphocytes Absolute: 2.8 10*3/uL (ref 0.7–3.1)
Lymphs: 46 %
MCH: 30.1 pg (ref 26.6–33.0)
MCHC: 33.9 g/dL (ref 31.5–35.7)
MCV: 89 fL (ref 79–97)
Monocytes Absolute: 0.5 10*3/uL (ref 0.1–0.9)
Monocytes: 8 %
Neutrophils Absolute: 2.7 10*3/uL (ref 1.4–7.0)
Neutrophils: 44 %
Platelets: 243 10*3/uL (ref 150–450)
RBC: 4.88 x10E6/uL (ref 4.14–5.80)
RDW: 12.8 % (ref 11.6–15.4)
WBC: 6 10*3/uL (ref 3.4–10.8)

## 2020-10-12 NOTE — Progress Notes (Signed)
Subjective:  Patient ID: Travis Dyer, male    DOB: 10/14/1990  Age: 30 y.o. MRN: [id number]  Chief Complaint  Patient presents with  . Hypertension    HPI: chronic visit  Patient presents for follow up of hypertension.  Patient tolerating bisoprolol well with side effects.  Patient was diagnosed with hypertension 2010 so has been treated for hypertension for 10 years.Patient is working on maintaining diet and exercise regimen and follows up as directed. Complication include none  Ankylosing spondylitis on humera and celebrex.He is doing well with rheumatology  GERD doing well on pantoprazole.    Current Outpatient Medications on File Prior to Visit  Medication Sig Dispense Refill  . celecoxib (CELEBREX) 200 MG capsule Take 200 mg by mouth 2 (two) times daily.    Marland Kitchen amphetamine-dextroamphetamine (ADDERALL) 15 MG tablet TAKE 1 TABLET BY MOUTH EVERY MORNING. 30 tablet 0  . bisoprolol (ZEBETA) 5 MG tablet Take 1 tablet (5 mg total) by mouth daily. 90 tablet 2  . HUMIRA PEN 40 MG/0.4ML PNKT     . ondansetron (ZOFRAN-ODT) 4 MG disintegrating tablet Take 4 mg by mouth every 6 (six) hours as needed.    . pantoprazole (PROTONIX) 40 MG tablet Take 1 tablet (40 mg total) by mouth daily. 90 tablet 2  . tamsulosin (FLOMAX) 0.4 MG CAPS capsule Take 0.4 mg by mouth daily as needed.     No current facility-administered medications on file prior to visit.   Past Medical History:  Diagnosis Date  . Ankylosing spondylitis (HCC) 09/17/2017  . Attention deficit hyperactivity disorder, predominantly inattentive type   . Essential hypertension 09/17/2017  . GERD (gastroesophageal reflux disease) 09/17/2017  . Inappropriate sinus tachycardia 12/07/2017  . Kidney stones 09/17/2017   History reviewed. No pertinent surgical history.  Family History  Problem Relation Age of Onset  . Hypertension Mother   . Hypertension Father   . Thyroid disease Sister   . Pancreatic cancer Maternal Grandmother   .  Diabetes Maternal Grandmother   . CAD Maternal Grandfather   . Stroke Paternal Grandfather    Social History   Socioeconomic History  . Marital status: Single    Spouse name: Not on file  . Number of children: Not on file  . Years of education: Not on file  . Highest education level: Not on file  Occupational History  . Not on file  Tobacco Use  . Smoking status: Never Smoker  . Smokeless tobacco: Never Used  Substance and Sexual Activity  . Alcohol use: Not on file  . Drug use: Not on file  . Sexual activity: Not on file  Other Topics Concern  . Not on file  Social History Narrative  . Not on file   Social Determinants of Health   Financial Resource Strain: Not on file  Food Insecurity: Not on file  Transportation Needs: Not on file  Physical Activity: Not on file  Stress: Not on file  Social Connections: Not on file    Review of Systems  Constitutional: Negative for activity change and appetite change.  HENT: Positive for congestion and rhinorrhea.   Respiratory: Negative for chest tightness and shortness of breath.   Cardiovascular: Negative for chest pain, palpitations and leg swelling.  Gastrointestinal: Negative for abdominal distention and abdominal pain.  Genitourinary: Negative for difficulty urinating and dysuria.  Musculoskeletal: Positive for back pain. Negative for arthralgias.  Skin: Negative.   Neurological: Negative.   Psychiatric/Behavioral: Negative.      Objective:  BP 116/64   Pulse 81   Temp (!) 97.3 F (36.3 C)   Ht 5\' 8"  (1.727 m)   Wt 191 lb (86.6 kg)   SpO2 99%   BMI 29.04 kg/m   BP/Weight 10/12/2020 07/08/2020 10/06/2019  Systolic BP 116 128 135  Diastolic BP 64 80 84  Wt. (Lbs) 191 197.8 -  BMI 29.04 30.08 -    Physical Exam Vitals reviewed.  Constitutional:      Appearance: Normal appearance.  HENT:     Head: Normocephalic.     Right Ear: Tympanic membrane, ear canal and external ear normal.     Left Ear: Tympanic  membrane, ear canal and external ear normal.     Mouth/Throat:     Mouth: Mucous membranes are moist.     Pharynx: Oropharynx is clear.  Eyes:     Extraocular Movements: Extraocular movements intact.     Conjunctiva/sclera: Conjunctivae normal.     Pupils: Pupils are equal, round, and reactive to light.  Cardiovascular:     Rate and Rhythm: Normal rate and regular rhythm.     Pulses: Normal pulses.     Heart sounds: Normal heart sounds. No murmur heard. No gallop.   Pulmonary:     Effort: Pulmonary effort is normal. No respiratory distress.     Breath sounds: Normal breath sounds. No rales.  Abdominal:     General: Abdomen is flat. Bowel sounds are normal. There is no distension.     Palpations: Abdomen is soft.     Tenderness: There is no abdominal tenderness.  Musculoskeletal:        General: Normal range of motion.     Cervical back: Normal range of motion and neck supple.  Skin:    General: Skin is warm and dry.     Capillary Refill: Capillary refill takes less than 2 seconds.  Neurological:     General: No focal deficit present.     Mental Status: He is alert and oriented to person, place, and time.  Psychiatric:        Mood and Affect: Mood normal.        Thought Content: Thought content normal.       Lab Results  Component Value Date   WBC 7.4 07/08/2020   HGB 14.1 07/08/2020   HCT 40.4 07/08/2020   PLT 258 07/08/2020   GLUCOSE 90 07/08/2020   ALT 26 07/08/2020   AST 17 07/08/2020   NA 143 07/08/2020   K 4.2 07/08/2020   CL 105 07/08/2020   CREATININE 1.00 07/08/2020   BUN 13 07/08/2020   CO2 23 07/08/2020      Assessment & Plan:   Diagnoses and all orders for this visit: Essential hypertension -     CBC with Differential/Platelet -     Comprehensive metabolic panel An individual hypertension care plan was established and reinforced today.  The patient's status was assessed using clinical findings on exam and labs or diagnostic tests. The patient's  success at meeting treatment goals on disease specific evidence-based guidelines and found to be fair controlled. SELF MANAGEMENT: The patient and I together assessed ways to personally work towards obtaining the recommended goals. RECOMMENDATIONS: avoid decongestants found in common cold remedies, decrease consumption of alcohol, perform routine monitoring of BP with home BP cuff, exercise, reduction of dietary salt, take medicines as prescribed, try not to miss doses and quit smoking.  Regular exercise and maintaining a healthy weight is needed.  Stress reduction may  help. A CLINICAL SUMMARY including written plan identify barriers to care unique to individual due to social or financial issues.  We attempt to mutually creat solutions for individual and family understanding.  Gastroesophageal reflux disease, unspecified whether esophagitis present Plan of care was formulated today.  he is doing well .  A plan of care was formulated using patient exam, tests and other sources to optimize care using evidence based information.  Recommend no smoking, no eating after supper, avoid fatty foods, elevate Head of bed, avoid tight fitting clothing.  Continue on pantoprazole . Ankylosing spondylitis, unspecified site of spine [facility]) Patient is doing well on DMARD and celebrex  Attention deficit disorder, unspecified hyperactivity presence AN INDIVIDUAL CARE PLAN for ADD was established and reinforced today.  The patient's status was assessed using clinical findings on exam, labs, and other diagnostic testing. Patient's success at meeting treatment goals based on disease specific evidence-bassed guidelines and found to be in good control. RECOMMENDATIONS include maintaining present medicines and treatment.         Follow-up: Return in about 3 months (around 01/11/2021).  An After Visit Summary was printed and given to the patient.  Brent Bulla, MD [facility] [phone]

## 2020-10-12 NOTE — Progress Notes (Signed)
Subjective:  Patient ID: Travis Dyer, male    DOB: 1991/04/30  Age: 30 y.o. MRN: [id number]  No chief complaint on file.   HPI   Current Outpatient Medications on File Prior to Visit  Medication Sig Dispense Refill  . amphetamine-dextroamphetamine (ADDERALL) 15 MG tablet TAKE 1 TABLET BY MOUTH EVERY MORNING. 30 tablet 0  . bisoprolol (ZEBETA) 5 MG tablet Take 1 tablet (5 mg total) by mouth daily. 90 tablet 2  . HUMIRA PEN 40 MG/0.4ML PNKT     . ondansetron (ZOFRAN-ODT) 4 MG disintegrating tablet Take 4 mg by mouth every 6 (six) hours as needed.    . pantoprazole (PROTONIX) 40 MG tablet Take 1 tablet (40 mg total) by mouth daily. 90 tablet 2  . tamsulosin (FLOMAX) 0.4 MG CAPS capsule Take 0.4 mg by mouth daily as needed.     No current facility-administered medications on file prior to visit.   Past Medical History:  Diagnosis Date  . Ankylosing spondylitis (HCC) 09/17/2017  . Attention deficit hyperactivity disorder, predominantly inattentive type   . Essential hypertension 09/17/2017  . GERD (gastroesophageal reflux disease) 09/17/2017  . Inappropriate sinus tachycardia 12/07/2017  . Kidney stones 09/17/2017   No past surgical history on file.  Family History  Problem Relation Age of Onset  . Hypertension Mother   . Hypertension Father   . Thyroid disease Sister   . Pancreatic cancer Maternal Grandmother   . Diabetes Maternal Grandmother   . CAD Maternal Grandfather   . Stroke Paternal Grandfather    Social History   Socioeconomic History  . Marital status: Single    Spouse name: Not on file  . Number of children: Not on file  . Years of education: Not on file  . Highest education level: Not on file  Occupational History  . Not on file  Tobacco Use  . Smoking status: Never Smoker  . Smokeless tobacco: Never Used  Substance and Sexual Activity  . Alcohol use: Not on file  . Drug use: Not on file  . Sexual activity: Not on file  Other Topics Concern  . Not on file   Social History Narrative  . Not on file   Social Determinants of Health   Financial Resource Strain: Not on file  Food Insecurity: Not on file  Transportation Needs: Not on file  Physical Activity: Not on file  Stress: Not on file  Social Connections: Not on file    Review of Systems  Constitutional: Negative for chills, diaphoresis, fatigue and fever.  HENT: Negative for congestion, ear pain and sore throat.   Respiratory: Negative for cough and shortness of breath.   Cardiovascular: Negative for chest pain and leg swelling.  Gastrointestinal: Negative for abdominal pain, constipation, diarrhea, nausea and vomiting.  Genitourinary: Negative for dysuria and urgency.  Musculoskeletal: Negative for arthralgias and myalgias.  Neurological: Negative for dizziness and headaches.  Psychiatric/Behavioral: Negative for dysphoric mood.     Objective:  There were no vitals taken for this visit.  BP/Weight 07/08/2020 10/06/2019 09/04/2019  Systolic BP 128 135 128  Diastolic BP 80 84 74  Wt. (Lbs) 197.8 - 193  BMI 30.08 - 29.35    Physical Exam Vitals reviewed.  Constitutional:      Appearance: Normal appearance. He is normal weight.  Cardiovascular:     Rate and Rhythm: Normal rate and regular rhythm.     Heart sounds: No murmur heard.   Pulmonary:     Effort: Pulmonary effort is  normal.     Breath sounds: Normal breath sounds.  Abdominal:     General: Abdomen is flat. Bowel sounds are normal.     Palpations: Abdomen is soft.     Tenderness: There is no abdominal tenderness.  Neurological:     Mental Status: He is alert and oriented to person, place, and time.  Psychiatric:        Mood and Affect: Mood normal.        Behavior: Behavior normal.     Diabetic Foot Exam - Simple   No data filed      Lab Results  Component Value Date   WBC 7.4 07/08/2020   HGB 14.1 07/08/2020   HCT 40.4 07/08/2020   PLT 258 07/08/2020   GLUCOSE 90 07/08/2020   ALT 26 07/08/2020    AST 17 07/08/2020   NA 143 07/08/2020   K 4.2 07/08/2020   CL 105 07/08/2020   CREATININE 1.00 07/08/2020   BUN 13 07/08/2020   CO2 23 07/08/2020      Assessment & Plan:   There are no diagnoses linked to this encounter.   No orders of the defined types were placed in this encounter.   No orders of the defined types were placed in this encounter.    Follow-up: No follow-ups on file.  An After Visit Summary was printed and given to the patient.  Brent Bulla, MD [facility] [phone]

## 2020-10-13 NOTE — Progress Notes (Signed)
CBC normal, kidney and liver tests normal,  lp

## 2020-12-31 ENCOUNTER — Other Ambulatory Visit: Payer: Self-pay | Admitting: Legal Medicine

## 2020-12-31 DIAGNOSIS — N200 Calculus of kidney: Secondary | ICD-10-CM

## 2021-01-13 ENCOUNTER — Ambulatory Visit: Payer: Commercial Managed Care - PPO | Admitting: Legal Medicine

## 2021-02-15 ENCOUNTER — Other Ambulatory Visit: Payer: Self-pay | Admitting: Legal Medicine

## 2021-02-15 DIAGNOSIS — N200 Calculus of kidney: Secondary | ICD-10-CM

## 2021-02-15 MED ORDER — AMPHETAMINE-DEXTROAMPHETAMINE 15 MG PO TABS: 15.0000 mg | ORAL_TABLET | Freq: Every morning | ORAL | 0 refills | Status: DC

## 2021-03-23 ENCOUNTER — Telehealth: Payer: Self-pay

## 2021-03-23 NOTE — Telephone Encounter (Signed)
Travis Dyer is 5 days out from covid today and he continues to have symptoms from his kidney stone.  He is having gross hematuria at times. Symptoms reviewed with Flonnie Hailstone, PA.  Appointment scheduled for tomorrow morning.

## 2021-03-24 ENCOUNTER — Other Ambulatory Visit: Payer: Self-pay

## 2021-03-24 ENCOUNTER — Encounter: Payer: Self-pay | Admitting: Nurse Practitioner

## 2021-03-24 ENCOUNTER — Other Ambulatory Visit: Payer: Self-pay | Admitting: Nurse Practitioner

## 2021-03-24 ENCOUNTER — Ambulatory Visit: Payer: Commercial Managed Care - PPO | Admitting: Nurse Practitioner

## 2021-03-24 VITALS — BP 122/80 | HR 79 | Temp 95.3°F | Ht 68.0 in | Wt 186.0 lb

## 2021-03-24 DIAGNOSIS — N23 Unspecified renal colic: Secondary | ICD-10-CM

## 2021-03-24 DIAGNOSIS — N200 Calculus of kidney: Secondary | ICD-10-CM | POA: Diagnosis not present

## 2021-03-24 DIAGNOSIS — R310 Gross hematuria: Secondary | ICD-10-CM | POA: Diagnosis not present

## 2021-03-24 DIAGNOSIS — Z87442 Personal history of urinary calculi: Secondary | ICD-10-CM

## 2021-03-24 LAB — POCT URINALYSIS DIPSTICK
Glucose, UA: NEGATIVE
Leukocytes, UA: NEGATIVE
Nitrite, UA: NEGATIVE
Protein, UA: POSITIVE — AB
Spec Grav, UA: 1.02 (ref 1.010–1.025)
Urobilinogen, UA: 0.2 E.U./dL
pH, UA: 6 (ref 5.0–8.0)

## 2021-03-24 MED ORDER — TAMSULOSIN HCL 0.4 MG PO CAPS: 0.4000 mg | ORAL_CAPSULE | Freq: Every day | ORAL | 1 refills | Status: DC | PRN

## 2021-03-24 NOTE — Progress Notes (Signed)
Subjective:  Patient ID: Travis Dyer, male    DOB: 05-17-1991  Age: 30 y.o. MRN: [id number]  CC: Left flank pain  HPI  Travis Dyer is a 30 year old Caucasian male that presents for urinary frequency, urgency, hesitancy, and dysuria. Onset of symptoms was a few weeks ago. Symptoms worsened 6-days-ago. Treatment has included pushing fluids, Flomax 0.4 mg, and He has a past history of renal stones. He tried to contact previous urologist but was unsuccessful.    Urinary symptoms Travis Dyer is a 30 year old Caucasian male that presents for urinary frequency, urgency, hesitancy, and dysuria. Onset of symptoms was a few weeks ago. Symptoms worsened 6-days-ago. Treatment has included pushing fluids, Flomax 0.4 mg, and He has a past history of renal stones. He tried to contact previous urologist but was unsuccessful.      Associated symptoms: No abdominal pain Yes back pain  No chills No constipation  No cramping No diarrhea  No discharge No fever  Yes hematuria Yes nausea  No vomiting      Current Outpatient Medications on File Prior to Visit  Medication Sig Dispense Refill   amphetamine-dextroamphetamine (ADDERALL) 15 MG tablet Take 1 tablet by mouth every morning. 30 tablet 0   bisoprolol (ZEBETA) 5 MG tablet Take 1 tablet (5 mg total) by mouth daily. 90 tablet 2   celecoxib (CELEBREX) 200 MG capsule Take 200 mg by mouth 2 (two) times daily.     HUMIRA PEN 40 MG/0.4ML PNKT      ondansetron (ZOFRAN-ODT) 4 MG disintegrating tablet Take 4 mg by mouth every 6 (six) hours as needed.     pantoprazole (PROTONIX) 40 MG tablet Take 1 tablet (40 mg total) by mouth daily. 90 tablet 2   tamsulosin (FLOMAX) 0.4 MG CAPS capsule Take 0.4 mg by mouth daily as needed.     No current facility-administered medications on file prior to visit.   Past Medical History:  Diagnosis Date   Ankylosing spondylitis (HCC) 09/17/2017   Attention deficit hyperactivity disorder, predominantly inattentive type    Essential  hypertension 09/17/2017   GERD (gastroesophageal reflux disease) 09/17/2017   Inappropriate sinus tachycardia 12/07/2017   Kidney stones 09/17/2017   No past surgical history on file.  Family History  Problem Relation Age of Onset   Hypertension Mother    Hypertension Father    Thyroid disease Sister    Pancreatic cancer Maternal Grandmother    Diabetes Maternal Grandmother    CAD Maternal Grandfather    Stroke Paternal Grandfather    Social History   Socioeconomic History   Marital status: Married    Spouse name: Not on file   Number of children: Not on file   Years of education: Not on file   Highest education level: Not on file  Occupational History   Not on file  Tobacco Use   Smoking status: Never   Smokeless tobacco: Never  Substance and Sexual Activity   Alcohol use: Not on file   Drug use: Not on file   Sexual activity: Not on file  Other Topics Concern   Not on file  Social History Narrative   Not on file   Social Determinants of Health   Financial Resource Strain: Not on file  Food Insecurity: Not on file  Transportation Needs: Not on file  Physical Activity: Not on file  Stress: Not on file  Social Connections: Not on file    Review of Systems  Constitutional:  Negative for chills, diaphoresis, fatigue and fever.  HENT:  Negative for congestion, ear pain and sore throat.   Respiratory:  Negative for cough and shortness of breath.   Cardiovascular:  Negative for chest pain and leg swelling.  Gastrointestinal:  Positive for nausea. Negative for abdominal pain, constipation, diarrhea and vomiting.  Genitourinary:  Positive for difficulty urinating, frequency and urgency. Negative for dysuria.  Musculoskeletal:  Positive for back pain (lower). Negative for arthralgias and myalgias.    Objective:  BP 122/80   Pulse 79   Temp (!) 95.3 F (35.2 C)   Ht 5\' 8"  (1.727 m)   Wt 186 lb (84.4 kg)   SpO2 99%   BMI 28.28 kg/m    BP/Weight 10/12/2020 07/08/2020  10/06/2019  Systolic BP 116 128 135  Diastolic BP 64 80 84  Wt. (Lbs) 191 197.8 -  BMI 29.04 30.08 -    Physical Exam Vitals reviewed.  Constitutional:      Appearance: Normal appearance.  Abdominal:     Tenderness: There is left CVA tenderness. There is no right CVA tenderness.  Skin:    General: Skin is warm and dry.     Capillary Refill: Capillary refill takes less than 2 seconds.  Neurological:     General: No focal deficit present.     Mental Status: He is alert and oriented to person, place, and time.  Psychiatric:        Mood and Affect: Mood normal.        Behavior: Behavior normal.        Lab Results  Component Value Date   WBC 6.0 10/12/2020   HGB 14.7 10/12/2020   HCT 43.3 10/12/2020   PLT 243 10/12/2020   GLUCOSE 91 10/12/2020   ALT 15 10/12/2020   AST 14 10/12/2020   NA 140 10/12/2020   K 4.5 10/12/2020   CL 103 10/12/2020   CREATININE 0.91 10/12/2020   BUN 16 10/12/2020   CO2 22 10/12/2020      Assessment & Plan:   .1. Kidney stone on left side -Ct with urogram stat at [facility] -CBC and CMP at [facility] today -push fluids, no solid food until results from CT -seek emergency medical care for severe pain of inability to void   2. Gross hematuria - Urine Culture - POCT urinalysis dipstick  3. Renal colic on left side   Obtain CT with urogram-stone study at [facility] today Obtain CBC and CMP at [facility] today Push fluids, strain urine for stone fragments We will call you with appt for CT for kidney stone and urologist appointment Take Flomax 0.4 mg as directed Take Bactrim twice daily for 7 days Seek emergency medical care for any worsening of symptoms Follow-up as needed    Follow-up: PRN  An After Visit Summary was printed and given to the patient.  I, [facility], NP, have reviewed all documentation for this visit. The documentation on 03/24/21 for the exam, diagnosis, procedures, and orders are all  accurate and complete.   05/24/21, NP [facility] [phone]

## 2021-03-24 NOTE — Patient Instructions (Addendum)
Push fluids, strain urine for stone fragments We will call you with appt for CT for kidney stone and urologist appointment Take Flomax 0.4 mg as directed Take Bactrim twice daily for 7 days Seek emergency medical care for any worsening of symptoms Follow-up as needed    Kidney Stones Kidney stones are rock-like masses that form inside of the kidneys. Kidneys are organs that make pee (urine). A kidney stone may move into other parts of the urinary tract, including: The tubes that connect the kidneys to the bladder (ureters). The bladder. The tube that carries urine out of the body (urethra). Kidney stones can cause very bad pain and can block the flow of pee. The stone usually leaves your body (passes) through your pee. You may need to have a doctor take out the stone. What are the causes? Kidney stones may be caused by: A condition in which certain glands make too much parathyroid hormone (primary hyperparathyroidism). A buildup of a type of crystals in the bladder made of a chemical called uric acid. The body makes uric acid when you eat certain foods. Narrowing (stricture) of one or both of the ureters. A kidney blockage that you were born with. Past surgery on the kidney or the ureters, such as gastric bypass surgery. What increases the risk? You are more likely to develop this condition if: You have had a kidney stone in the past. You have a family history of kidney stones. You do not drink enough water. You eat a diet that is high in protein, salt (sodium), or sugar. You are overweight or very overweight (obese). What are the signs or symptoms? Symptoms of a kidney stone may include: Pain in the side of the belly, right below the ribs (flank pain). Pain usually spreads (radiates) to the groin. Needing to pee often or right away (urgently). Pain when going pee (urinating). Blood in your pee (hematuria). Feeling like you may vomit (nauseous). Vomiting. Fever and chills. How  is this treated? Treatment depends on the size, location, and makeup of the kidney stones. The stones will often pass out of the body through peeing. You may need to: Drink more fluid to help pass the stone. In some cases, you may be given fluids through an IV tube put into one of your veins at the hospital. Take medicine for pain. Make changes in your diet to help keep kidney stones from coming back. Sometimes, medical procedures are needed to remove a kidney stone. This may involve: A procedure to break up kidney stones using a beam of light (laser) or shock waves. Surgery to remove the kidney stones. Follow these instructions at home: Medicines Take over-the-counter and prescription medicines only as told by your doctor. Ask your doctor if the medicine prescribed to you requires you to avoid driving or using heavy machinery. Eating and drinking Drink enough fluid to keep your pee pale yellow. You may be told to drink at least 8-10 glasses of water each day. This will help you pass the stone. If told by your doctor, change your diet. This may include: Limiting how much salt you eat. Eating more fruits and vegetables. Limiting how much meat, poultry, fish, and eggs you eat. Follow instructions from your doctor about eating or drinking restrictions. General instructions Collect pee samples as told by your doctor. You may need to collect a pee sample: 24 hours after a stone comes out. 8-12 weeks after a stone comes out, and every 6-12 months after that. Strain your pee  every time you pee (urinate), for as long as told. Use the strainer that your doctor recommends. Do not throw out the stone. Keep it so that it can be tested by your doctor. Keep all follow-up visits as told by your doctor. This is important. You may need follow-up tests. How is this prevented? To prevent another kidney stone: Drink enough fluid to keep your pee pale yellow. This is the best way to prevent kidney  stones. Eat healthy foods. Avoid certain foods as told by your doctor. You may be told to eat less protein. Stay at a healthy weight. Where to find more information National Kidney Foundation (NKF): www.kidney.org [facility]): www.urologyhealth.org Contact a doctor if: You have pain that gets worse or does not get better with medicine. Get help right away if: You have a fever or chills. You get very bad pain. You get new pain in your belly (abdomen). You pass out (faint). You cannot pee. Summary Kidney stones are rock-like masses that form inside of the kidneys. Kidney stones can cause very bad pain and can block the flow of pee. The stones will often pass out of the body through peeing. Drink enough fluid to keep your pee pale yellow. This information is not intended to replace advice given to you by your health care provider. Make sure you discuss any questions you have with your health care provider. Document Revised: 10/18/2018 Document Reviewed: 10/22/2018 Elsevier Patient Education  2022 [employer].

## 2021-03-24 NOTE — Addendum Note (Signed)
Addended by: Janie Morning on: 03/24/2021 02:12 PM   Modules accepted: Orders

## 2021-03-28 LAB — URINE CULTURE: Organism ID, Bacteria: NO GROWTH

## 2021-04-01 ENCOUNTER — Ambulatory Visit: Payer: Commercial Managed Care - PPO | Admitting: Nurse Practitioner

## 2021-04-28 ENCOUNTER — Other Ambulatory Visit: Payer: Self-pay | Admitting: Legal Medicine

## 2021-05-17 ENCOUNTER — Other Ambulatory Visit: Payer: Self-pay | Admitting: Legal Medicine

## 2021-05-17 DIAGNOSIS — N200 Calculus of kidney: Secondary | ICD-10-CM

## 2021-06-29 ENCOUNTER — Other Ambulatory Visit: Payer: Self-pay | Admitting: Nurse Practitioner

## 2021-06-29 DIAGNOSIS — N23 Unspecified renal colic: Secondary | ICD-10-CM

## 2021-07-14 ENCOUNTER — Other Ambulatory Visit: Payer: Self-pay | Admitting: Legal Medicine

## 2021-07-14 DIAGNOSIS — N200 Calculus of kidney: Secondary | ICD-10-CM

## 2021-07-14 MED ORDER — ONDANSETRON 4 MG PO TBDP: 4.0000 mg | ORAL_TABLET | Freq: Four times a day (QID) | ORAL | 3 refills | Status: AC | PRN

## 2021-07-14 MED ORDER — AMPHETAMINE-DEXTROAMPHETAMINE 15 MG PO TABS: 1.0000 | ORAL_TABLET | Freq: Every morning | ORAL | 0 refills | Status: DC

## 2021-07-22 ENCOUNTER — Other Ambulatory Visit: Payer: Self-pay | Admitting: Legal Medicine

## 2021-07-22 DIAGNOSIS — K219 Gastro-esophageal reflux disease without esophagitis: Secondary | ICD-10-CM

## 2021-09-05 ENCOUNTER — Other Ambulatory Visit: Payer: Self-pay | Admitting: Nurse Practitioner

## 2021-09-05 ENCOUNTER — Other Ambulatory Visit: Payer: Self-pay | Admitting: Legal Medicine

## 2021-09-05 DIAGNOSIS — N23 Unspecified renal colic: Secondary | ICD-10-CM

## 2021-09-05 DIAGNOSIS — N200 Calculus of kidney: Secondary | ICD-10-CM

## 2021-09-05 MED ORDER — AMPHETAMINE-DEXTROAMPHETAMINE 15 MG PO TABS: 1.0000 | ORAL_TABLET | Freq: Every morning | ORAL | 0 refills | Status: DC

## 2021-09-22 ENCOUNTER — Encounter: Payer: Self-pay | Admitting: Legal Medicine

## 2021-09-22 ENCOUNTER — Ambulatory Visit: Payer: Commercial Managed Care - PPO | Admitting: Legal Medicine

## 2021-09-22 VITALS — BP 110/80 | HR 89 | Temp 99.0°F | Resp 15 | Ht 68.0 in | Wt 195.0 lb

## 2021-09-22 DIAGNOSIS — N410 Acute prostatitis: Secondary | ICD-10-CM | POA: Diagnosis not present

## 2021-09-22 LAB — POCT URINALYSIS DIP (CLINITEK)
Bilirubin, UA: NEGATIVE
Glucose, UA: NEGATIVE mg/dL
Ketones, POC UA: NEGATIVE mg/dL
Leukocytes, UA: NEGATIVE
Nitrite, UA: NEGATIVE
POC PROTEIN,UA: NEGATIVE
Spec Grav, UA: 1.015 (ref 1.010–1.025)
Urobilinogen, UA: 0.2 E.U./dL
pH, UA: 6 (ref 5.0–8.0)

## 2021-09-22 MED ORDER — SULFAMETHOXAZOLE-TRIMETHOPRIM 800-160 MG PO TABS: 1.0000 | ORAL_TABLET | Freq: Two times a day (BID) | ORAL | 2 refills | Status: AC

## 2021-09-22 NOTE — Progress Notes (Signed)
? ?Acute Office Visit ? ?Subjective:  ? ? Patient ID: Travis Dyer, male    DOB: July 22, 1990, 31 y.o.   MRN: [id number] ? ?Chief Complaint  ?Patient presents with  ? Back Pain  ? Urinary Frequency  ? ? ?HPI: ?Patient is in today for abdominal pain, back pain, urgency urination and difficulty to empty the stomach since 5 months ago. This problem comes and goes. He denied fever, chills.In 2022, he had bilateral caliceal stones but saw [facility] and diagnosed with acute prostatitis causing back pain ? ?Past Medical History:  ?Diagnosis Date  ? Ankylosing spondylitis ([place]) 09/17/2017  ? Attention deficit hyperactivity disorder, predominantly inattentive type   ? Essential hypertension 09/17/2017  ? GERD (gastroesophageal reflux disease) 09/17/2017  ? 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 Kidney stones 09/17/2017  ? ? ?History reviewed. No pertinent surgical history. ? ?Family History  ?Problem Relation Age of Onset  ? Hypertension Mother   ? Hypertension Father   ? Thyroid disease Sister   ? Pancreatic cancer Maternal Grandmother   ? Diabetes Maternal Grandmother   ? CAD Maternal Grandfather   ? Stroke Paternal Grandfather   ? ? ?Social History  ? ?Socioeconomic History  ? Marital status: Married  ?  Spouse name: Not on file  ? Number of children: Not on file  ? Years of education: Not on file  ? Highest education level: Not on file  ?Occupational History  ? Not on file  ?Tobacco Use  ? Smoking status: Never  ? Smokeless tobacco: Never  ?Substance and Sexual Activity  ? Alcohol use: Not on file  ? Drug use: Not on file  ? Sexual activity: Not on file  ?Other Topics Concern  ? Not on file  ?Social History Narrative  ? Not on file  ? ?Social Determinants of Health  ? ?Financial Resource Strain: Not on file  ?Food Insecurity: Not on file  ?Transportation Needs: Not on file  ?Physical Activity: Not on file  ?Stress: Not on file  ?Social Connections: Not on file  ?Intimate Partner Violence: Not on file  ? ? ?Outpatient  Medications Prior to Visit  ?Medication Sig Dispense Refill  ? amphetamine-dextroamphetamine (ADDERALL) 15 MG tablet Take 1 tablet by mouth every morning. 30 tablet 0  ? bisoprolol (ZEBETA) 5 MG tablet TAKE ONE TABLET BY MOUTH EVERY DAY 90 tablet 2  ? celecoxib (CELEBREX) 200 MG capsule Take 200 mg by mouth 2 (two) times daily.    ? HUMIRA PEN 40 MG/0.4ML PNKT     ? ondansetron (ZOFRAN-ODT) 4 MG disintegrating tablet Take 1 tablet (4 mg total) by mouth every 6 (six) hours as needed. 20 tablet 3  ? pantoprazole (PROTONIX) 40 MG tablet TAKE 1 TABLET BY MOUTH ONCE DAILY. 90 tablet 2  ? tamsulosin (FLOMAX) 0.4 MG CAPS capsule TAKE 1 CAPSULE BY MOUTH EVERY DAY AS NEEDED 30 capsule 1  ? ?No facility-administered medications prior to visit.  ? ? ?Allergies  ?Allergen Reactions  ? Bee Venom Hives  ? Etodolac   ?  Other reaction(s): stomach  ? ? ?Review of Systems  ?Constitutional:  Negative for chills, fatigue, fever and unexpected weight change.  ?HENT:  Negative for congestion, ear pain, sinus pain and sore throat.   ?Respiratory:  Negative for cough and shortness of breath.   ?Cardiovascular:  Negative for chest pain and palpitations.  ?Gastrointestinal:  Positive for abdominal pain. Negative for blood in stool, constipation, diarrhea, nausea and vomiting.  ?Endocrine: Negative for  polydipsia.  ?Genitourinary:  Positive for difficulty urinating, testicular pain and urgency. Negative for dysuria.  ?Musculoskeletal:  Positive for back pain.  ?Skin:  Negative for rash.  ?Neurological:  Negative for headaches.  ? ?   ?Objective:  ?  ?Physical Exam ?Vitals reviewed.  ?Constitutional:   ?   General: He is in acute distress.  ?   Appearance: Normal appearance.  ?HENT:  ?   Right Ear: Tympanic membrane normal.  ?   Left Ear: Tympanic membrane normal.  ?   Mouth/Throat:  ?   Mouth: Mucous membranes are moist.  ?   Pharynx: Oropharynx is clear.  ?Eyes:  ?   Extraocular Movements: Extraocular movements intact.  ?    Conjunctiva/sclera: Conjunctivae normal.  ?   Pupils: Pupils are equal, round, and reactive to light.  ?Cardiovascular:  ?   Rate and Rhythm: Normal rate and regular rhythm.  ?   Pulses: Normal pulses.  ?   Heart sounds: Normal heart sounds. No murmur heard. ?  No gallop.  ?Pulmonary:  ?   Effort: Pulmonary effort is normal. No respiratory distress.  ?   Breath sounds: Normal breath sounds. No wheezing.  ?Abdominal:  ?   General: Abdomen is flat. Bowel sounds are normal. There is no distension.  ?   Palpations: Abdomen is soft.  ?   Tenderness: There is no abdominal tenderness.  ?Genitourinary: ?   Comments: Tender enlarged prostate, symmetrical ?Musculoskeletal:     ?   General: Normal range of motion.  ?   Cervical back: Normal range of motion and neck supple.  ?   Right lower leg: No edema.  ?Skin: ?   General: Skin is warm.  ?Neurological:  ?   General: No focal deficit present.  ?   Mental Status: He is alert. Mental status is at baseline.  ? ? ?BP 110/80   Pulse 89   Temp 99 ?F (37.2 ?C)   Resp 15   Ht _0  (1.727 m)   Wt 195 lb (88.5 kg)   SpO2 98%   BMI 29.65 kg/m?  ?Wt Readings from Last 3 Encounters:  ?09/22/21 195 lb (88.5 kg)  ?03/24/21 186 lb (84.4 kg)  ?10/12/20 191 lb (86.6 kg)  ? ? ?There are no preventive care reminders to display for this patient. ? ?There are no preventive care reminders to display for this patient. ? ? ?No results found for: TSH ?Lab Results  ?Component Value Date  ? WBC 6.0 10/12/2020  ? HGB 14.7 10/12/2020  ? HCT 43.3 10/12/2020  ? MCV 89 10/12/2020  ? PLT 243 10/12/2020  ? ?Lab Results  ?Component Value Date  ? NA 140 10/12/2020  ? K 4.5 10/12/2020  ? CO2 22 10/12/2020  ? GLUCOSE 91 10/12/2020  ? BUN 16 10/12/2020  ? CREATININE 0.91 10/12/2020  ? BILITOT 0.6 10/12/2020  ? ALKPHOS 78 10/12/2020  ? AST 14 10/12/2020  ? ALT 15 10/12/2020  ? PROT 7.0 10/12/2020  ? ALBUMIN 4.5 10/12/2020  ? CALCIUM 9.5 10/12/2020  ? EGFR 117 10/12/2020  ? ?No results found for: CHOL ?No  results found for: HDL ?No results found for: [place] ?No results found for: TRIG ?No results found for: CHOLHDL ?No results found for: HGBA1C ? ?   ?Assessment & Plan:  ? ?Problem List Items Addressed This Visit   ?None ?Visit Diagnoses   ? ? Acute prostatitis    -  Primary  ? Relevant Medications  ? sulfamethoxazole-trimethoprim (  BACTRIM DS) 800-160 MG tablet  ? Other Relevant Orders  ? POCT URINALYSIS DIP (CLINITEK) (Completed) ?Patient has acute recurrent prostatitis, treat with bactrim for one month, we discussed prostatitis  ? ?  ? ?Meds ordered this encounter  ?Medications  ? sulfamethoxazole-trimethoprim (BACTRIM DS) 800-160 MG tablet  ?  Sig: Take 1 tablet by mouth 2 (two) times daily.  ?  Dispense:  60 tablet  ?  Refill:  2  ? ? ?Orders Placed This Encounter  ?Procedures  ? POCT URINALYSIS DIP (CLINITEK)  ?  ? ?Follow-up: Return if symptoms worsen or fail to improve. ? ?An After Visit Summary was printed and given to the patient. ? ?Reinaldo Meeker, MD ?[place] ?[phone]

## 2021-11-10 ENCOUNTER — Other Ambulatory Visit: Payer: Self-pay | Admitting: Legal Medicine

## 2021-11-10 ENCOUNTER — Other Ambulatory Visit: Payer: Self-pay

## 2021-11-10 DIAGNOSIS — N200 Calculus of kidney: Secondary | ICD-10-CM

## 2021-11-11 MED ORDER — AMPHETAMINE-DEXTROAMPHETAMINE 15 MG PO TABS: 1.0000 | ORAL_TABLET | Freq: Every morning | ORAL | 0 refills | Status: DC

## 2021-11-15 NOTE — Progress Notes (Addendum)
Subjective:  Patient ID: Travis Dyer, male    DOB: 22-Aug-1990  Age: 31 y.o. MRN: [id number]  No chief complaint on file.   HPI ADHD: He is taking Adderall 15 mg daily.  GERD: Taking Pantoprazole 40 mg daily.   Current Outpatient Medications on File Prior to Visit  Medication Sig Dispense Refill   amphetamine-dextroamphetamine (ADDERALL) 15 MG tablet Take 1 tablet by mouth every morning. 30 tablet 0   bisoprolol (ZEBETA) 5 MG tablet TAKE ONE TABLET BY MOUTH EVERY DAY 90 tablet 2   celecoxib (CELEBREX) 200 MG capsule Take 200 mg by mouth 2 (two) times daily.     HUMIRA PEN 40 MG/0.4ML PNKT      ondansetron (ZOFRAN-ODT) 4 MG disintegrating tablet Take 1 tablet (4 mg total) by mouth every 6 (six) hours as needed. 20 tablet 3   pantoprazole (PROTONIX) 40 MG tablet TAKE 1 TABLET BY MOUTH ONCE DAILY. 90 tablet 2   sulfamethoxazole-trimethoprim (BACTRIM DS) 800-160 MG tablet Take 1 tablet by mouth 2 (two) times daily. 60 tablet 2   tamsulosin (FLOMAX) 0.4 MG CAPS capsule TAKE 1 CAPSULE BY MOUTH EVERY DAY AS NEEDED 30 capsule 1   No current facility-administered medications on file prior to visit.   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  Family History  Problem Relation Age of Onset   Hypertension Mother    Hypertension Father    Thyroid disease Sister    Pancreatic cancer Maternal Grandmother    Diabetes Maternal Grandmother    CAD Maternal Grandfather    Stroke Paternal Grandfather    Social History   Socioeconomic History   Marital status: Married    Spouse name: Not on file   Number of children: Not on file   Years of education: Not on file   Highest education level:  Not on file  Occupational History   Not on file  Tobacco Use   Smoking status: Never   Smokeless tobacco: Never  Substance and Sexual Activity   Alcohol use: Not on file   Drug use: Not on file   Sexual activity: Not on file  Other Topics Concern   Not on file  Social History Narrative   Not on file   Social Determinants of Health   Financial Resource Strain: Not on file  Food Insecurity: Not on file  Transportation Needs: Not on file  Physical Activity: Not on file  Stress: Not on file  Social Connections: Not on file    Review of Systems   Objective:  There were no vitals taken for this visit.     09/22/2021    8:13 AM 03/24/2021   10:50 AM 10/12/2020    7:56 AM  BP/Weight  Systolic BP 110 122 116  Diastolic BP 80 80 64  Wt. (Lbs) 195 186 191  BMI 29.65 kg/m2 28.28 kg/m2 29.04 kg/m2    Physical Exam  Diabetic Foot Exam - Simple   No data filed      Lab Results  Component Value Date   WBC 6.0 10/12/2020   HGB 14.7 10/12/2020   HCT 43.3 10/12/2020   PLT 243 10/12/2020   GLUCOSE 91 10/12/2020   ALT 15 10/12/2020   AST  14 10/12/2020   NA 140 10/12/2020   K 4.5 10/12/2020   CL 103 10/12/2020   CREATININE 0.91 10/12/2020   BUN 16 10/12/2020   CO2 22 10/12/2020      Assessment & Plan:   Problem List Items Addressed This Visit       Cardiovascular and Mediastinum   Essential hypertension - Primary     Digestive   GERD (gastroesophageal reflux disease)     Other   ADD (attention deficit disorder)  .  No orders of the defined types were placed in this encounter.   No orders of the defined types were placed in this encounter.    Follow-up: No follow-ups on file.  An After Visit Summary was printed and given to the patient.  Brent Bulla, MD [facility] [phone]

## 2021-11-16 ENCOUNTER — Encounter: Payer: Self-pay | Admitting: Legal Medicine

## 2021-11-16 ENCOUNTER — Ambulatory Visit (INDEPENDENT_AMBULATORY_CARE_PROVIDER_SITE_OTHER): Payer: Commercial Managed Care - PPO | Admitting: Legal Medicine

## 2021-11-16 VITALS — BP 100/70 | HR 81 | Temp 98.7°F | Resp 15 | Ht 68.0 in | Wt 198.0 lb

## 2021-11-16 DIAGNOSIS — I10 Essential (primary) hypertension: Secondary | ICD-10-CM

## 2021-11-16 DIAGNOSIS — N411 Chronic prostatitis: Secondary | ICD-10-CM | POA: Diagnosis not present

## 2021-11-16 DIAGNOSIS — K219 Gastro-esophageal reflux disease without esophagitis: Secondary | ICD-10-CM

## 2021-11-16 DIAGNOSIS — F988 Other specified behavioral and emotional disorders with onset usually occurring in childhood and adolescence: Secondary | ICD-10-CM | POA: Diagnosis not present

## 2021-11-16 DIAGNOSIS — N200 Calculus of kidney: Secondary | ICD-10-CM

## 2021-11-16 MED ORDER — CIPROFLOXACIN HCL 500 MG PO TABS: 500.0000 mg | ORAL_TABLET | Freq: Two times a day (BID) | ORAL | 2 refills | Status: AC

## 2021-11-16 MED ORDER — AMPHETAMINE-DEXTROAMPHETAMINE 15 MG PO TABS: 1.0000 | ORAL_TABLET | Freq: Every morning | ORAL | 0 refills | Status: AC

## 2022-01-02 ENCOUNTER — Ambulatory Visit (HOSPITAL_BASED_OUTPATIENT_CLINIC_OR_DEPARTMENT_OTHER): Payer: Commercial Managed Care - PPO | Admitting: Cardiovascular Disease

## 2022-01-09 NOTE — Progress Notes (Unsigned)
Subjective:  Patient ID: Travis Dyer, male    DOB: [birth year]  Age: 31 y.o. MRN: [id number]  No chief complaint on file.   HPI Patient presents for follow up of hypertension.  Patient tolerating Bisoprolol 5 mg every day. Patient is working on maintaining diet and exercise regimen and follows up as directed.   GERD: He takes Pantoprazole 40 mg daily.  ADHD: He is taking Adderall 15 mg every morning. Current Outpatient Medications on File Prior to Visit  Medication Sig Dispense Refill   amphetamine-dextroamphetamine (ADDERALL) 15 MG tablet Take 1 tablet by mouth every morning. 30 tablet 0   bisoprolol (ZEBETA) 5 MG tablet TAKE ONE TABLET BY MOUTH EVERY DAY 90 tablet 2   celecoxib (CELEBREX) 200 MG capsule Take 200 mg by mouth 2 (two) times daily.     ciprofloxacin (CIPRO) 500 MG tablet Take 1 tablet (500 mg total) by mouth 2 (two) times daily. 30 tablet 2   HUMIRA PEN 40 MG/0.4ML PNKT      ondansetron (ZOFRAN-ODT) 4 MG disintegrating tablet Take 1 tablet (4 mg total) by mouth every 6 (six) hours as needed. 20 tablet 3   pantoprazole (PROTONIX) 40 MG tablet TAKE 1 TABLET BY MOUTH ONCE DAILY. 90 tablet 2   predniSONE (DELTASONE) 5 MG tablet Take 5-10 mg by mouth daily as needed.     sulfamethoxazole-trimethoprim (BACTRIM DS) 800-160 MG tablet Take 1 tablet by mouth 2 (two) times daily. 60 tablet 2   tamsulosin (FLOMAX) 0.4 MG CAPS capsule TAKE 1 CAPSULE BY MOUTH EVERY DAY AS NEEDED 30 capsule 1   No current facility-administered medications on file prior to visit.   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  Family History  Problem Relation Age of Onset   Hypertension Mother     Hypertension Father    Thyroid disease Sister    Pancreatic cancer Maternal Grandmother    Diabetes Maternal Grandmother    CAD Maternal Grandfather    Stroke Paternal Grandfather    Social History   Socioeconomic History   Marital status: Married    Spouse name: Not on file   Number of children: Not on file   Years of education: Not on file   Highest education level: Not on file  Occupational History   Not on file  Tobacco Use   Smoking status: Never   Smokeless tobacco: Never  Substance and Sexual Activity   Alcohol use: Not on file   Drug use: Not on file   Sexual activity: Not on file  Other Topics Concern   Not on file  Social History Narrative   Not on file   Social Determinants of Health   Financial Resource Strain: Not on file  Food Insecurity: Not on file  Transportation Needs: Not on file  Physical Activity: Not on file  Stress: Not on file  Social Connections: Not on file    Review of Systems   Objective:  There were no vitals taken for this visit.     11/16/2021    8:58 AM 09/22/2021    8:13 AM 03/24/2021   10:50 AM  BP/Weight  Systolic BP 100 110 122  Diastolic BP 70 80 80  Wt. (Lbs) 198  195 186  BMI 30.11 kg/m2 29.65 kg/m2 28.28 kg/m2    Physical Exam  Diabetic Foot Exam - Simple   No data filed      Lab Results  Component Value Date   WBC 6.0 10/12/2020   HGB 14.7 10/12/2020   HCT 43.3 10/12/2020   PLT 243 10/12/2020   GLUCOSE 91 10/12/2020   ALT 15 10/12/2020   AST 14 10/12/2020   NA 140 10/12/2020   K 4.5 10/12/2020   CL 103 10/12/2020   CREATININE 0.91 10/12/2020   BUN 16 10/12/2020   CO2 22 10/12/2020      Assessment & Plan:   Problem List Items Addressed This Visit       Cardiovascular and Mediastinum   Essential hypertension - Primary     Digestive   GERD (gastroesophageal reflux disease)     Other   ADD (attention deficit disorder)  .  No orders of the defined types were placed in this  encounter.   No orders of the defined types were placed in this encounter.    Follow-up: No follow-ups on file.  An After Visit Summary was printed and given to the patient.  Brent Bulla, MD [facility] [phone]

## 2022-01-10 ENCOUNTER — Encounter: Payer: Commercial Managed Care - PPO | Admitting: Legal Medicine

## 2022-01-10 DIAGNOSIS — I10 Essential (primary) hypertension: Secondary | ICD-10-CM

## 2022-01-10 DIAGNOSIS — K219 Gastro-esophageal reflux disease without esophagitis: Secondary | ICD-10-CM

## 2022-01-10 DIAGNOSIS — F909 Attention-deficit hyperactivity disorder, unspecified type: Secondary | ICD-10-CM

## 2022-02-19 NOTE — Progress Notes (Unsigned)
Subjective:  Patient ID: Travis Dyer, male    DOB: 14-Nov-1990  Age: 31 y.o. MRN: [id number]  No chief complaint on file.   HPI   Current Outpatient Medications on File Prior to Visit  Medication Sig Dispense Refill   amphetamine-dextroamphetamine (ADDERALL) 15 MG tablet Take 1 tablet by mouth every morning. 30 tablet 0   bisoprolol (ZEBETA) 5 MG tablet TAKE ONE TABLET BY MOUTH EVERY DAY 90 tablet 2   celecoxib (CELEBREX) 200 MG capsule Take 200 mg by mouth 2 (two) times daily.     ciprofloxacin (CIPRO) 500 MG tablet Take 1 tablet (500 mg total) by mouth 2 (two) times daily. 30 tablet 2   HUMIRA PEN 40 MG/0.4ML PNKT      ondansetron (ZOFRAN-ODT) 4 MG disintegrating tablet Take 1 tablet (4 mg total) by mouth every 6 (six) hours as needed. 20 tablet 3   pantoprazole (PROTONIX) 40 MG tablet TAKE 1 TABLET BY MOUTH ONCE DAILY. 90 tablet 2   predniSONE (DELTASONE) 5 MG tablet Take 5-10 mg by mouth daily as needed.     sulfamethoxazole-trimethoprim (BACTRIM DS) 800-160 MG tablet Take 1 tablet by mouth 2 (two) times daily. 60 tablet 2   tamsulosin (FLOMAX) 0.4 MG CAPS capsule TAKE 1 CAPSULE BY MOUTH EVERY DAY AS NEEDED 30 capsule 1   No current facility-administered medications on file prior to visit.   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  Family History  Problem Relation Age of Onset   Hypertension Mother    Hypertension Father    Thyroid disease Sister    Pancreatic cancer Maternal Grandmother    Diabetes Maternal Grandmother    CAD Maternal Grandfather    Stroke Paternal Grandfather    Social History   Socioeconomic History   Marital status: Married    Spouse name: Not  on file   Number of children: Not on file   Years of education: Not on file   Highest education level: Not on file  Occupational History   Not on file  Tobacco Use   Smoking status: Never   Smokeless tobacco: Never  Substance and Sexual Activity   Alcohol use: Not on file   Drug use: Not on file   Sexual activity: Not on file  Other Topics Concern   Not on file  Social History Narrative   Not on file   Social Determinants of Health   Financial Resource Strain: Not on file  Food Insecurity: Not on file  Transportation Needs: Not on file  Physical Activity: Not on file  Stress: Not on file  Social Connections: Not on file    Review of Systems   Objective:  There were no vitals taken for this visit.     11/16/2021    8:58 AM 09/22/2021    8:13 AM 03/24/2021   10:50 AM  BP/Weight  Systolic BP 100 110 122  Diastolic BP 70 80 80  Wt. (Lbs) 198 195 186  BMI 30.11 kg/m2 29.65 kg/m2 28.28 kg/m2    Physical Exam  Diabetic Foot Exam - Simple   No data filed      Lab Results  Component Value Date   WBC 6.0 10/12/2020   HGB 14.7  10/12/2020   HCT 43.3 10/12/2020   PLT 243 10/12/2020   GLUCOSE 91 10/12/2020   ALT 15 10/12/2020   AST 14 10/12/2020   NA 140 10/12/2020   K 4.5 10/12/2020   CL 103 10/12/2020   CREATININE 0.91 10/12/2020   BUN 16 10/12/2020   CO2 22 10/12/2020      Assessment & Plan:   Problem List Items Addressed This Visit       Cardiovascular and Mediastinum   Essential hypertension - Primary     Digestive   GERD (gastroesophageal reflux disease)     Other   ADD (attention deficit disorder)  .  No orders of the defined types were placed in this encounter.   No orders of the defined types were placed in this encounter.    Follow-up: No follow-ups on file.  An After Visit Summary was printed and given to the patient.  Brent Bulla, MD [facility] [phone]

## 2022-02-21 ENCOUNTER — Ambulatory Visit: Payer: Commercial Managed Care - PPO | Admitting: Legal Medicine

## 2022-02-21 DIAGNOSIS — K219 Gastro-esophageal reflux disease without esophagitis: Secondary | ICD-10-CM

## 2022-02-21 DIAGNOSIS — I10 Essential (primary) hypertension: Secondary | ICD-10-CM

## 2022-02-21 DIAGNOSIS — F909 Attention-deficit hyperactivity disorder, unspecified type: Secondary | ICD-10-CM

## 2022-03-15 ENCOUNTER — Other Ambulatory Visit: Payer: Self-pay | Admitting: Legal Medicine

## 2022-03-15 DIAGNOSIS — N200 Calculus of kidney: Secondary | ICD-10-CM

## 2022-03-15 NOTE — Progress Notes (Signed)
This encounter was created in error - please disregard.

## 2022-04-24 ENCOUNTER — Other Ambulatory Visit: Payer: Self-pay | Admitting: Legal Medicine

## 2022-04-24 DIAGNOSIS — N200 Calculus of kidney: Secondary | ICD-10-CM

## 2022-04-24 DIAGNOSIS — I10 Essential (primary) hypertension: Secondary | ICD-10-CM

## 2022-05-10 ENCOUNTER — Encounter: Payer: Self-pay | Admitting: Legal Medicine

## 2022-06-21 ENCOUNTER — Other Ambulatory Visit: Payer: Self-pay | Admitting: Nurse Practitioner

## 2022-06-21 DIAGNOSIS — N23 Unspecified renal colic: Secondary | ICD-10-CM

## 2022-08-01 ENCOUNTER — Other Ambulatory Visit: Payer: Self-pay

## 2022-08-01 DIAGNOSIS — K219 Gastro-esophageal reflux disease without esophagitis: Secondary | ICD-10-CM

## 2022-08-01 MED ORDER — PANTOPRAZOLE SODIUM 40 MG PO TBEC: 40.0000 mg | DELAYED_RELEASE_TABLET | Freq: Every day | ORAL | 2 refills | Status: AC

## 2022-09-04 ENCOUNTER — Other Ambulatory Visit: Payer: Self-pay

## 2022-09-04 DIAGNOSIS — N23 Unspecified renal colic: Secondary | ICD-10-CM

## 2022-09-05 ENCOUNTER — Telehealth: Payer: Self-pay | Admitting: Family Medicine

## 2022-09-05 NOTE — Telephone Encounter (Signed)
LEFT VOICEMAIL FOR PT TO CALL MAIN OFFICE TO GET APT SCHEDULE OVER DUE FOR APPT. PER DR.COX IF PT IS OKAY TO SEE BRADY, PA  PLEASE SCHEDULED WITH HIM

## 2022-09-06 ENCOUNTER — Telehealth: Payer: Self-pay

## 2022-09-06 NOTE — Telephone Encounter (Signed)
-----   Message from Rochel Brome, MD sent at 09/04/2022  9:25 PM EDT ----- Regarding: needs appt. Please see if this patient is still coming to [place]. He cancelled his last 2 appts with Dr. Henrene Pastor  If he is still coming to [place], please call and schedule appointment with [place] for a fasting physical with Loletha Grayer in May or June.  I received a request for tamsulosin.  This was given for kidney stones in the past. I am unsure why he would need this. If he is feeling like he has a kidney stone, he will need to be seen either by urgent care or [place].  Dr. Tobie Poet

## 2022-09-06 NOTE — Telephone Encounter (Signed)
Called  Patient left VM for patient to call office back to schedule appointment.

## 2023-02-26 ENCOUNTER — Other Ambulatory Visit: Payer: Self-pay

## 2023-02-26 DIAGNOSIS — I10 Essential (primary) hypertension: Secondary | ICD-10-CM

## 2023-02-26 MED ORDER — BISOPROLOL FUMARATE 5 MG PO TABS: 5.0000 mg | ORAL_TABLET | Freq: Every day | ORAL | 2 refills | Status: AC

## 2023-07-16 ENCOUNTER — Other Ambulatory Visit: Payer: Self-pay | Admitting: Family Medicine

## 2023-07-16 DIAGNOSIS — K219 Gastro-esophageal reflux disease without esophagitis: Secondary | ICD-10-CM

## 2024-03-10 ENCOUNTER — Other Ambulatory Visit: Payer: Self-pay

## 2024-03-10 DIAGNOSIS — I10 Essential (primary) hypertension: Secondary | ICD-10-CM
